# Patient Record
Sex: Female | Born: 1985 | Race: Asian | Hispanic: Yes | Marital: Married | Smoking: Never smoker
Health system: Southern US, Community
[De-identification: ages and names within clinical notes are randomized; demographics above are authoritative.]

## PROBLEM LIST (undated history)

## (undated) DIAGNOSIS — O09299 Supervision of pregnancy with other poor reproductive or obstetric history, unspecified trimester: Secondary | ICD-10-CM

## (undated) DIAGNOSIS — I8390 Asymptomatic varicose veins of unspecified lower extremity: Secondary | ICD-10-CM

## (undated) HISTORY — DX: Asymptomatic varicose veins of unspecified lower extremity: I83.90

## (undated) HISTORY — DX: Supervision of pregnancy with other poor reproductive or obstetric history, unspecified trimester: O09.299

---

## 2012-08-12 LAB — OB RESULTS CONSOLE GC/CHLAMYDIA
Chlamydia: NEGATIVE
Gonorrhea: NEGATIVE

## 2012-08-12 LAB — OB RESULTS CONSOLE HEPATITIS B SURFACE ANTIGEN: Hepatitis B Surface Ag: NEGATIVE

## 2012-08-13 ENCOUNTER — Other Ambulatory Visit (HOSPITAL_COMMUNITY): Payer: Self-pay | Admitting: Nurse Practitioner

## 2012-08-13 DIAGNOSIS — Z0489 Encounter for examination and observation for other specified reasons: Secondary | ICD-10-CM

## 2012-08-14 ENCOUNTER — Ambulatory Visit (HOSPITAL_COMMUNITY)
Admission: RE | Admit: 2012-08-14 | Discharge: 2012-08-14 | Disposition: A | Discharge status: Home / Self Care | Payer: Medicaid Other | Source: Ambulatory Visit | Attending: Nurse Practitioner | Admitting: Nurse Practitioner

## 2012-08-14 ENCOUNTER — Other Ambulatory Visit (HOSPITAL_COMMUNITY): Payer: Self-pay | Admitting: Nurse Practitioner

## 2012-08-14 DIAGNOSIS — Z1389 Encounter for screening for other disorder: Secondary | ICD-10-CM | POA: Insufficient documentation

## 2012-08-14 DIAGNOSIS — Z0489 Encounter for examination and observation for other specified reasons: Secondary | ICD-10-CM

## 2012-08-14 DIAGNOSIS — O358XX0 Maternal care for other (suspected) fetal abnormality and damage, not applicable or unspecified: Secondary | ICD-10-CM | POA: Insufficient documentation

## 2012-08-14 DIAGNOSIS — Z363 Encounter for antenatal screening for malformations: Secondary | ICD-10-CM | POA: Insufficient documentation

## 2012-09-09 ENCOUNTER — Other Ambulatory Visit (HOSPITAL_COMMUNITY): Payer: Self-pay | Admitting: Nurse Practitioner

## 2012-09-09 DIAGNOSIS — O365990 Maternal care for other known or suspected poor fetal growth, unspecified trimester, not applicable or unspecified: Secondary | ICD-10-CM

## 2012-09-14 ENCOUNTER — Ambulatory Visit (HOSPITAL_COMMUNITY)
Admission: RE | Admit: 2012-09-14 | Discharge: 2012-09-14 | Disposition: A | Discharge status: Home / Self Care | Payer: Medicaid Other | Source: Ambulatory Visit | Attending: Nurse Practitioner | Admitting: Nurse Practitioner

## 2012-09-14 DIAGNOSIS — O4400 Placenta previa specified as without hemorrhage, unspecified trimester: Secondary | ICD-10-CM | POA: Insufficient documentation

## 2012-09-14 DIAGNOSIS — O365990 Maternal care for other known or suspected poor fetal growth, unspecified trimester, not applicable or unspecified: Secondary | ICD-10-CM

## 2012-09-14 DIAGNOSIS — Z3689 Encounter for other specified antenatal screening: Secondary | ICD-10-CM | POA: Insufficient documentation

## 2012-10-05 ENCOUNTER — Other Ambulatory Visit (HOSPITAL_COMMUNITY): Payer: Self-pay | Admitting: Physician Assistant

## 2012-10-05 DIAGNOSIS — O4440 Low lying placenta NOS or without hemorrhage, unspecified trimester: Secondary | ICD-10-CM

## 2012-10-19 ENCOUNTER — Encounter (HOSPITAL_COMMUNITY): Payer: Self-pay

## 2012-10-19 ENCOUNTER — Ambulatory Visit (HOSPITAL_COMMUNITY)
Admission: RE | Admit: 2012-10-19 | Discharge: 2012-10-19 | Disposition: A | Discharge status: Home / Self Care | Payer: Medicaid Other | Source: Ambulatory Visit | Attending: Physician Assistant | Admitting: Physician Assistant

## 2012-10-19 DIAGNOSIS — Z3689 Encounter for other specified antenatal screening: Secondary | ICD-10-CM | POA: Insufficient documentation

## 2012-10-19 DIAGNOSIS — O4440 Low lying placenta NOS or without hemorrhage, unspecified trimester: Secondary | ICD-10-CM

## 2012-10-19 DIAGNOSIS — O4400 Placenta previa specified as without hemorrhage, unspecified trimester: Secondary | ICD-10-CM | POA: Insufficient documentation

## 2012-10-23 ENCOUNTER — Other Ambulatory Visit (HOSPITAL_COMMUNITY): Payer: Self-pay | Admitting: Nurse Practitioner

## 2012-10-23 ENCOUNTER — Other Ambulatory Visit: Payer: Self-pay | Admitting: Family Medicine

## 2012-10-23 DIAGNOSIS — O288 Other abnormal findings on antenatal screening of mother: Secondary | ICD-10-CM

## 2012-11-02 ENCOUNTER — Ambulatory Visit (HOSPITAL_COMMUNITY)
Admission: RE | Admit: 2012-11-02 | Discharge: 2012-11-02 | Disposition: A | Discharge status: Home / Self Care | Payer: Medicaid Other | Source: Ambulatory Visit | Attending: Nurse Practitioner | Admitting: Nurse Practitioner

## 2012-11-02 DIAGNOSIS — O288 Other abnormal findings on antenatal screening of mother: Secondary | ICD-10-CM

## 2012-11-02 DIAGNOSIS — O4100X0 Oligohydramnios, unspecified trimester, not applicable or unspecified: Secondary | ICD-10-CM | POA: Insufficient documentation

## 2012-11-02 DIAGNOSIS — Z3689 Encounter for other specified antenatal screening: Secondary | ICD-10-CM | POA: Insufficient documentation

## 2012-11-03 ENCOUNTER — Other Ambulatory Visit (HOSPITAL_COMMUNITY): Payer: Self-pay | Admitting: Family

## 2012-11-03 DIAGNOSIS — Z3689 Encounter for other specified antenatal screening: Secondary | ICD-10-CM

## 2012-11-30 ENCOUNTER — Ambulatory Visit (HOSPITAL_COMMUNITY)
Admission: RE | Admit: 2012-11-30 | Discharge: 2012-11-30 | Disposition: A | Discharge status: Home / Self Care | Payer: Medicaid Other | Source: Ambulatory Visit | Attending: Family | Admitting: Family

## 2012-11-30 DIAGNOSIS — Z3689 Encounter for other specified antenatal screening: Secondary | ICD-10-CM

## 2012-11-30 DIAGNOSIS — O4100X0 Oligohydramnios, unspecified trimester, not applicable or unspecified: Secondary | ICD-10-CM | POA: Insufficient documentation

## 2012-11-30 DIAGNOSIS — O4400 Placenta previa specified as without hemorrhage, unspecified trimester: Secondary | ICD-10-CM | POA: Insufficient documentation

## 2012-12-15 LAB — OB RESULTS CONSOLE GBS: GBS: NEGATIVE

## 2013-01-11 ENCOUNTER — Other Ambulatory Visit (HOSPITAL_COMMUNITY): Payer: Self-pay | Admitting: Nurse Practitioner

## 2013-01-11 DIAGNOSIS — O480 Post-term pregnancy: Secondary | ICD-10-CM

## 2013-01-12 ENCOUNTER — Encounter (HOSPITAL_COMMUNITY): Payer: Self-pay | Admitting: *Deleted

## 2013-01-12 ENCOUNTER — Telehealth (HOSPITAL_COMMUNITY): Payer: Self-pay | Admitting: *Deleted

## 2013-01-12 NOTE — Telephone Encounter (Signed)
Preadmission screen Interpreter number [phone]

## 2013-01-12 NOTE — Telephone Encounter (Signed)
[phone] interpreter number

## 2013-01-14 ENCOUNTER — Ambulatory Visit (HOSPITAL_COMMUNITY)
Admission: RE | Admit: 2013-01-14 | Discharge: 2013-01-14 | Disposition: A | Discharge status: Home / Self Care | Payer: Medicaid Other | Source: Ambulatory Visit | Attending: Nurse Practitioner | Admitting: Nurse Practitioner

## 2013-01-14 DIAGNOSIS — O480 Post-term pregnancy: Secondary | ICD-10-CM | POA: Insufficient documentation

## 2013-01-14 DIAGNOSIS — Z3689 Encounter for other specified antenatal screening: Secondary | ICD-10-CM | POA: Insufficient documentation

## 2013-01-21 ENCOUNTER — Inpatient Hospital Stay (HOSPITAL_COMMUNITY): Admission: RE | Admit: 2013-01-21 | Payer: Medicaid Other | Source: Ambulatory Visit

## 2013-01-21 ENCOUNTER — Inpatient Hospital Stay (HOSPITAL_COMMUNITY)
Admission: RE | Admit: 2013-01-21 | Discharge: 2013-02-01 | DRG: 765 | Disposition: A | Discharge status: Home / Self Care | Payer: Medicaid Other | Source: Ambulatory Visit | Attending: Surgery | Admitting: Surgery

## 2013-01-21 ENCOUNTER — Encounter (HOSPITAL_COMMUNITY): Payer: Self-pay

## 2013-01-21 ENCOUNTER — Ambulatory Visit (HOSPITAL_COMMUNITY): Payer: Medicaid Other

## 2013-01-21 ENCOUNTER — Inpatient Hospital Stay (HOSPITAL_COMMUNITY): Payer: Medicaid Other

## 2013-01-21 VITALS — BP 105/66 | HR 80 | Temp 98.4°F | Resp 18 | Ht 61.0 in | Wt 137.8 lb

## 2013-01-21 DIAGNOSIS — K661 Hemoperitoneum: Secondary | ICD-10-CM

## 2013-01-21 DIAGNOSIS — R58 Hemorrhage, not elsewhere classified: Secondary | ICD-10-CM

## 2013-01-21 DIAGNOSIS — D65 Disseminated intravascular coagulation [defibrination syndrome]: Secondary | ICD-10-CM

## 2013-01-21 DIAGNOSIS — Y838 Other surgical procedures as the cause of abnormal reaction of the patient, or of later complication, without mention of misadventure at the time of the procedure: Secondary | ICD-10-CM | POA: Diagnosis not present

## 2013-01-21 DIAGNOSIS — D689 Coagulation defect, unspecified: Secondary | ICD-10-CM | POA: Diagnosis not present

## 2013-01-21 DIAGNOSIS — R000 Tachycardia, unspecified: Secondary | ICD-10-CM | POA: Diagnosis present

## 2013-01-21 DIAGNOSIS — O34219 Maternal care for unspecified type scar from previous cesarean delivery: Secondary | ICD-10-CM | POA: Diagnosis present

## 2013-01-21 DIAGNOSIS — O411090 Infection of amniotic sac and membranes, unspecified, unspecified trimester, not applicable or unspecified: Secondary | ICD-10-CM

## 2013-01-21 DIAGNOSIS — Z90710 Acquired absence of both cervix and uterus: Secondary | ICD-10-CM | POA: Diagnosis not present

## 2013-01-21 DIAGNOSIS — O324XX0 Maternal care for high head at term, not applicable or unspecified: Secondary | ICD-10-CM | POA: Diagnosis present

## 2013-01-21 DIAGNOSIS — J95821 Acute postprocedural respiratory failure: Secondary | ICD-10-CM

## 2013-01-21 DIAGNOSIS — T8119XA Other postprocedural shock, initial encounter: Secondary | ICD-10-CM

## 2013-01-21 DIAGNOSIS — Z603 Acculturation difficulty: Secondary | ICD-10-CM

## 2013-01-21 DIAGNOSIS — IMO0002 Reserved for concepts with insufficient information to code with codable children: Secondary | ICD-10-CM

## 2013-01-21 DIAGNOSIS — R578 Other shock: Secondary | ICD-10-CM

## 2013-01-21 DIAGNOSIS — O480 Post-term pregnancy: Principal | ICD-10-CM | POA: Diagnosis present

## 2013-01-21 DIAGNOSIS — Y9210 Unspecified residential institution as the place of occurrence of the external cause: Secondary | ICD-10-CM | POA: Diagnosis not present

## 2013-01-21 DIAGNOSIS — K683 Retroperitoneal hematoma: Secondary | ICD-10-CM

## 2013-01-21 LAB — CBC
MCHC: 34.7 g/dL (ref 30.0–36.0)
RDW: 12.6 % (ref 11.5–15.5)

## 2013-01-21 LAB — ABO/RH: ABO/RH(D): A POS

## 2013-01-21 LAB — RPR: RPR Ser Ql: NONREACTIVE

## 2013-01-21 MED ORDER — OXYTOCIN 40 UNITS IN LACTATED RINGERS INFUSION - SIMPLE MED: 62.5000 mL/h | INTRAVENOUS | Status: DC

## 2013-01-21 MED ORDER — LACTATED RINGERS IV SOLN: 500.0000 mL | Freq: Once | INTRAVENOUS | Status: DC

## 2013-01-21 MED ORDER — FENTANYL 2.5 MCG/ML BUPIVACAINE 1/10 % EPIDURAL INFUSION (WH - ANES)
14.0000 mL/h | INTRAMUSCULAR | Status: DC | PRN
  Administered 2013-01-21 – 2013-01-22 (×2): 14 mL/h via EPIDURAL
  Filled 2013-01-21 (×2): qty 125

## 2013-01-21 MED ORDER — NALBUPHINE SYRINGE 5 MG/0.5 ML
10.0000 mg | INJECTION | INTRAMUSCULAR | Status: DC | PRN
Start: 2013-01-21 — End: 2013-01-22
  Administered 2013-01-21: 10 mg via INTRAVENOUS
  Filled 2013-01-21: qty 1

## 2013-01-21 MED ORDER — EPHEDRINE 5 MG/ML INJ
10.0000 mg | INTRAVENOUS | Status: DC | PRN
  Filled 2013-01-21: qty 4

## 2013-01-21 MED ORDER — CITRIC ACID-SODIUM CITRATE 334-500 MG/5ML PO SOLN
30.0000 mL | ORAL | Status: DC | PRN
  Administered 2013-01-22: 30 mL via ORAL
  Filled 2013-01-21: qty 15

## 2013-01-21 MED ORDER — IBUPROFEN 600 MG PO TABS: 600.0000 mg | ORAL_TABLET | Freq: Four times a day (QID) | ORAL | Status: DC | PRN

## 2013-01-21 MED ORDER — TERBUTALINE SULFATE 1 MG/ML IJ SOLN: 0.2500 mg | Freq: Once | INTRAMUSCULAR | Status: AC | PRN

## 2013-01-21 MED ORDER — OXYTOCIN BOLUS FROM INFUSION: 500.0000 mL | INTRAVENOUS | Status: DC

## 2013-01-21 MED ORDER — LACTATED RINGERS IV SOLN
INTRAVENOUS | Status: DC
  Administered 2013-01-21: 15:00:00 via INTRAVENOUS
  Administered 2013-01-21: 125 mL/h via INTRAVENOUS
  Administered 2013-01-21: 08:00:00 via INTRAVENOUS

## 2013-01-21 MED ORDER — LIDOCAINE HCL (PF) 1 % IJ SOLN
INTRAMUSCULAR | Status: DC | PRN
  Administered 2013-01-21 (×2): 5 mL

## 2013-01-21 MED ORDER — LIDOCAINE HCL (PF) 1 % IJ SOLN
30.0000 mL | INTRAMUSCULAR | Status: DC | PRN
  Filled 2013-01-21: qty 30

## 2013-01-21 MED ORDER — PHENYLEPHRINE 40 MCG/ML (10ML) SYRINGE FOR IV PUSH (FOR BLOOD PRESSURE SUPPORT)
80.0000 ug | PREFILLED_SYRINGE | INTRAVENOUS | Status: DC | PRN
  Administered 2013-01-22: 80 ug via INTRAVENOUS

## 2013-01-21 MED ORDER — OXYCODONE-ACETAMINOPHEN 5-325 MG PO TABS: 1.0000 | ORAL_TABLET | ORAL | Status: DC | PRN

## 2013-01-21 MED ORDER — OXYTOCIN 40 UNITS IN LACTATED RINGERS INFUSION - SIMPLE MED
1.0000 m[IU]/min | INTRAVENOUS | Status: DC
  Administered 2013-01-21: 2 m[IU]/min via INTRAVENOUS
  Filled 2013-01-21: qty 1000

## 2013-01-21 MED ORDER — LACTATED RINGERS IV SOLN
500.0000 mL | INTRAVENOUS | Status: DC | PRN
  Administered 2013-01-22: 500 mL via INTRAVENOUS

## 2013-01-21 MED ORDER — ONDANSETRON HCL 4 MG/2ML IJ SOLN: 4.0000 mg | Freq: Four times a day (QID) | INTRAMUSCULAR | Status: DC | PRN

## 2013-01-21 MED ORDER — EPHEDRINE 5 MG/ML INJ: 10.0000 mg | INTRAVENOUS | Status: DC | PRN

## 2013-01-21 MED ORDER — ACETAMINOPHEN 325 MG PO TABS
650.0000 mg | ORAL_TABLET | ORAL | Status: DC | PRN
  Administered 2013-01-22: 650 mg via ORAL
  Filled 2013-01-21 (×2): qty 1

## 2013-01-21 MED ORDER — DIPHENHYDRAMINE HCL 50 MG/ML IJ SOLN: 12.5000 mg | INTRAMUSCULAR | Status: DC | PRN

## 2013-01-21 MED ORDER — FLEET ENEMA 7-19 GM/118ML RE ENEM: 1.0000 | ENEMA | RECTAL | Status: DC | PRN

## 2013-01-21 MED ORDER — PHENYLEPHRINE 40 MCG/ML (10ML) SYRINGE FOR IV PUSH (FOR BLOOD PRESSURE SUPPORT)
80.0000 ug | PREFILLED_SYRINGE | INTRAVENOUS | Status: DC | PRN
  Filled 2013-01-21 (×2): qty 5

## 2013-01-21 NOTE — Progress Notes (Signed)
Late entry-  [employer] used for admission interview

## 2013-01-21 NOTE — Progress Notes (Signed)
Patient ID: [name], female   DOB: [birth year], 27 y.o.   MRN: [id number]  Doing well but now requests an epidural Husband here now.  AVSS Filed Vitals:   01/21/13 1720 01/21/13 1756 01/21/13 1857 01/21/13 1858  BP: 109/65 118/62  122/66  Pulse: 72 77  84  Temp:      TempSrc:      Resp:   18   Height:      Weight:      SpO2:       FHR reactive, no decels but continues to have episodes of arrhythmia  Dilation: 5 Effacement (%): 90 Cervical Position: Anterior Station: -1 Presentation: Vertex Exam by:: fran, cnm  Continue to observe

## 2013-01-21 NOTE — Anesthesia Preprocedure Evaluation (Signed)
Anesthesia Evaluation  Patient identified by MRN, date of birth, ID band Patient awake    Reviewed: Allergy & Precautions, H&P , Patient's Chart, lab work & pertinent test results  Airway Mallampati: II TM Distance: >3 FB Neck ROM: full    Dental no notable dental hx.    Pulmonary neg pulmonary ROS,  breath sounds clear to auscultation  Pulmonary exam normal       Cardiovascular negative cardio ROS  Rhythm:regular Rate:Normal     Neuro/Psych negative neurological ROS  negative psych ROS   GI/Hepatic negative GI ROS, Neg liver ROS,   Endo/Other  negative endocrine ROS  Renal/GU negative Renal ROS     Musculoskeletal   Abdominal   Peds  Hematology negative hematology ROS (+)   Anesthesia Other Findings Varicose veins     Hx of macrosomia in infant in prior pregnancy, currently pregnant   Abnormal fetal heart rhythm  Reproductive/Obstetrics (+) Pregnancy                           Anesthesia Physical Anesthesia Plan  ASA: II  Anesthesia Plan: Epidural   Post-op Pain Management:    Induction:   Airway Management Planned:   Additional Equipment:   Intra-op Plan:   Post-operative Plan:   Informed Consent: I have reviewed the patients History and Physical, chart, labs and discussed the procedure including the risks, benefits and alternatives for the proposed anesthesia with the patient or authorized representative who has indicated his/her understanding and acceptance.     Plan Discussed with:   Anesthesia Plan Comments:         Anesthesia Quick Evaluation

## 2013-01-21 NOTE — H&P (Signed)

## 2013-01-21 NOTE — Anesthesia Procedure Notes (Signed)
Epidural Patient location during procedure: OB Start time: 01/21/2013 8:02 PM  Staffing Anesthesiologist: Angus Seller., Harrell Gave. Performed by: anesthesiologist   Preanesthetic Checklist Completed: patient identified, site marked, surgical consent, pre-op evaluation, timeout performed, IV checked, risks and benefits discussed and monitors and equipment checked  Epidural Patient position: sitting Prep: site prepped and draped and DuraPrep Patient monitoring: continuous pulse ox and blood pressure Approach: midline Injection technique: LOR air and LOR saline  Needle:  Needle type: Tuohy  Needle gauge: 17 G Needle length: 9 cm and 9 Needle insertion depth: 5 cm cm Catheter type: closed end flexible Catheter size: 19 Gauge Catheter at skin depth: 10 cm Test dose: negative  Assessment Events: blood not aspirated, injection not painful, no injection resistance, negative IV test and no paresthesia  Additional Notes Patient identified.  Risk benefits discussed including failed block, incomplete pain control, headache, nerve damage, paralysis, blood pressure changes, nausea, vomiting, reactions to medication both toxic or allergic, and postpartum back pain.  Patient expressed understanding and wished to proceed.  All questions were answered.  Sterile technique used throughout procedure and epidural site dressed with sterile barrier dressing. No paresthesia or other complications noted.The patient did not experience any signs of intravascular injection such as tinnitus or metallic taste in mouth nor signs of intrathecal spread such as rapid motor block. Please see nursing notes for vital signs.

## 2013-01-21 NOTE — Progress Notes (Signed)
Mackenzie [name] is a 27 y.o. G3P1011 at [redacted]w[redacted]d by admitted for induction of labor due to Post dates.  Subjective: Comfortable with epidural  Objective: BP 115/59  Pulse 79  Temp(Src) 99.5 F (37.5 C) (Oral)  Resp 18  Ht 5\' 1"  (1.549 m)  Wt 69.4 kg (153 lb)  BMI 28.92 kg/m2  SpO2 99%  LMP 03/22/2012   Total I/O In: -  Out: 200 [Urine:200]  FHT:  FHR: 140 bpm, variability: moderate,  accelerations:  Present,  decelerations:  Present Periods where fetal HR drops to the 50's secondary to arrythmia UC:   regular, every 1-3 minutes  SVE:  Dilation 9.5  Dilation: Lip/rim Effacement (%): 100 Station: 0;+1 Exam by:: T. Sprague RN  Labs: Lab Results  Component Value Date   WBC 11.5* 01/21/2013   HGB 13.5 01/21/2013   HCT 38.9 01/21/2013   MCV 90.5 01/21/2013   PLT 126* 01/21/2013    Assessment / Plan: Induction of labor due to post dates, progressing well on pitocin  Labor: Progressing normally Fetal Wellbeing:  Category II Pain Control:  Epidural Anticipated MOD:  NSVD  Mackenzie [name] Other 01/21/2013, 10:02 PM  I have seen and examined this patient and agree the above assessment. Mackenzie [name] 01/22/2013 2:06 AM

## 2013-01-21 NOTE — Progress Notes (Signed)
   Mackenzie [name] is a 27 y.o. G3P1011 at [redacted]w[redacted]d  admitted for induction of labor due to Post dates.. TOLAC Subjective: Desires IV pain meds  Objective: BP 109/65  Pulse 72  Temp(Src) 97.4 F (36.3 C) (Oral)  Resp 18  Ht 5\' 1"  (1.549 m)  Wt 69.4 kg (153 lb)  BMI 28.92 kg/m2  SpO2 99%  LMP 03/22/2012    FHT:  FHR: 140 bpm, variability: moderate,  accelerations:  Present,  decelerations:  Present irregular arrythmia where FHR is in the 50's, usually no more than 1-2 minutes UC:   regular, every 2-3 minutes SVE:   Dilation: 5 Effacement (%): 90 Station: -1 Exam by:: fran, cnm Pitocin @ 10 mu/min SROM with light mec  Labs: Lab Results  Component Value Date   WBC 11.5* 01/21/2013   HGB 13.5 01/21/2013   HCT 38.9 01/21/2013   MCV 90.5 01/21/2013   PLT 126* 01/21/2013    Assessment / Plan: Induction of labor due to postterm,  progressing well on pitocin  Labor: Progressing normally Fetal Wellbeing:  Category II Pain Control:  nubain Anticipated MOD:  NSVD  CRESENZO-DISHMAN,Meribeth Vitug 01/21/2013, 5:34 PM

## 2013-01-21 NOTE — H&P (Signed)
Mackenzie [name] is a 27 y.o. female presenting for IOL for postdates . Maternal Medical History:  Reason for admission: Nausea.  Contractions: Frequency: rare.   Perceived severity is mild.    Fetal activity: Perceived fetal activity is normal.   Last perceived fetal movement was within the past hour.    Prenatal complications: No bleeding.   Prenatal Complications - Diabetes: none.    OB History   Grav Para Term Preterm Abortions TAB SAB Ect Mult Living   3 1 1  1  1   1      Past Medical History  Diagnosis Date  . Varicose veins   . Hx of macrosomia in infant in prior pregnancy, currently pregnant    Past Surgical History  Procedure Laterality Date  . Cesarean section     Family History: family history is negative for Alcohol abuse, and Arthritis, and Asthma, and Birth defects, and Cancer, and COPD, and Depression, and Diabetes, and Drug abuse, and Early death, and Hearing loss, and Heart disease, and Kidney disease, and Hypertension, and Hyperlipidemia, and Learning disabilities, and Mental illness, and Mental retardation, and Miscarriages / Stillbirths, and Stroke, and Vision loss, . Social History:  reports that she has never smoked. She has never used smokeless tobacco. She reports that she does not drink alcohol or use illicit drugs.  Review of Systems  Constitutional: Negative for fever, chills and malaise/fatigue.  Gastrointestinal: Negative for nausea, vomiting, abdominal pain, diarrhea and constipation.  Genitourinary: Negative for dysuria.  Neurological: Negative for dizziness and headaches.    Dilation: 2.5 Effacement (%): 90 Station:  (BBOW) Exam by:: k. shaw, cnm Blood pressure 113/64, pulse 84, temperature 97.2 F (36.2 C), temperature source Oral, resp. rate 18, height 5\' 1"  (1.549 m), weight 69.4 kg (153 lb), last menstrual period 03/22/2012, SpO2 98.00%. Maternal Exam:  Uterine Assessment: Contraction strength is mild.  Contraction frequency is rare.    Abdomen: Fundal height is 37.   Estimated fetal weight is 8.   Fetal presentation: vertex  Introitus: Normal vulva. Normal vagina.  Ferning test: not done.  Nitrazine test: not done. Amniotic fluid character: not assessed.  Pelvis: adequate for delivery.   Cervix: Cervix evaluated by digital exam.     Fetal Exam Fetal Monitor Review: Mode: ultrasound.   Baseline rate: 145.  Variability: moderate (6-25 bpm).   Pattern: accelerations present.    Fetal State Assessment: Category II - tracings are indeterminate.    Fetal heart rate Arrhythmia noted with intervals between contractions Heart rate is counted as 50s when the arrhythmias occur  Physical Exam  Constitutional: She is oriented to person, place, and time. She appears well-developed and well-nourished. No distress.  HENT:  Head: Normocephalic.  Cardiovascular: Normal rate and regular rhythm.   Respiratory: Effort normal.  GI: Soft. She exhibits no distension. There is no tenderness. There is no rebound and no guarding.  Musculoskeletal: Normal range of motion.  Neurological: She is alert and oriented to person, place, and time.  Skin: Skin is warm and dry.  Psychiatric: She has a normal mood and affect.    Prenatal labs: ABO, Rh: A/Positive/-- (12/11 0000) Antibody: Negative (12/11 0000) Rubella: Immune (12/11 0000) RPR: Nonreactive (12/11 0000)  HBsAg: Negative (12/11 0000)  HIV: Non-reactive (12/11 0000)  GBS: Negative (04/15 0000)   Assessment/Plan: A:  SIUP at [redacted]w[redacted]d       Post Dates      Language Barrier      Fetal Arrhythmia  P:  Admit to [employer]      Routine orders      Pitocin      Would like Epidural, but Anesthesia is hesitant to do an elective procedure without an interpretor   D      Dr Leroy Libman and Sherrie George consulted.  Will watch FHR pattern. If worsens may expedite delivery.   [facility] 01/21/2013, 10:03 AM

## 2013-01-21 NOTE — Progress Notes (Signed)
Patient ID: Mackenzie [name], female   DOB: [birth year], 27 y.o.   MRN: [id number]  Notified by RN that presenting part could not be felt in pelvis.  FHR stable but continues to have arrhythmia episodes UCs irregular  Dilation: 2.5 Effacement (%): 90 Cervical Position: Anterior Station:  (BBOW) Presentation: Vertex (by WH u/s) Exam by:: hk  [place] ordered >> confirmed vertex

## 2013-01-21 NOTE — Progress Notes (Signed)
   Mackenzie [name] is a 27 y.o. G3P1011 at [redacted]w[redacted]d  admitted for induction of labor due to Post dates.. TOLAC Subjective: Comfortable with epidural  Objective: BP 101/57  Pulse 74  Temp(Src) 99.5 F (37.5 C) (Oral)  Resp 18  Ht 5\' 1"  (1.549 m)  Wt 69.4 kg (153 lb)  BMI 28.92 kg/m2  SpO2 99%  LMP 03/22/2012 Total I/O In: -  Out: 200 [Urine:200]  FHT:  FHR: 140 bpm, variability: moderate,  accelerations:  Present,  decelerations:  Present irregular arrythmia where FHR is in the 50's, usually no more than 1-2 minutes UC:   regular, every 2-3 minutes SVE:   Dilation: 8 Effacement (%): 90 Station: 0 Exam by:: T. Sprague Pitocin @ 10 mu/min  Labs: Lab Results  Component Value Date   WBC 11.5* 01/21/2013   HGB 13.5 01/21/2013   HCT 38.9 01/21/2013   MCV 90.5 01/21/2013   PLT 126* 01/21/2013    Assessment / Plan: Induction of labor due to postterm,  progressing well on pitocin  Labor: Progressing normally Fetal Wellbeing:  Category II Pain Control:  epidural Anticipated MOD:  NSVD  CRESENZO-DISHMAN,Ranell Skibinski 01/21/2013, 9:31 PM

## 2013-01-22 ENCOUNTER — Encounter (HOSPITAL_COMMUNITY): Payer: Self-pay | Admitting: Anesthesiology

## 2013-01-22 ENCOUNTER — Inpatient Hospital Stay (HOSPITAL_COMMUNITY): Payer: Medicaid Other | Admitting: Anesthesiology

## 2013-01-22 ENCOUNTER — Encounter (HOSPITAL_COMMUNITY)
Admission: RE | Disposition: A | Discharge status: Home / Self Care | Payer: Self-pay | Source: Ambulatory Visit | Attending: Critical Care Medicine

## 2013-01-22 ENCOUNTER — Inpatient Hospital Stay (HOSPITAL_COMMUNITY): Payer: Medicaid Other

## 2013-01-22 ENCOUNTER — Encounter (HOSPITAL_COMMUNITY): Payer: Self-pay

## 2013-01-22 DIAGNOSIS — T8119XA Other postprocedural shock, initial encounter: Secondary | ICD-10-CM

## 2013-01-22 DIAGNOSIS — R578 Other shock: Secondary | ICD-10-CM | POA: Diagnosis not present

## 2013-01-22 DIAGNOSIS — D689 Coagulation defect, unspecified: Secondary | ICD-10-CM

## 2013-01-22 DIAGNOSIS — O34219 Maternal care for unspecified type scar from previous cesarean delivery: Secondary | ICD-10-CM

## 2013-01-22 DIAGNOSIS — Z90710 Acquired absence of both cervix and uterus: Secondary | ICD-10-CM | POA: Diagnosis not present

## 2013-01-22 DIAGNOSIS — IMO0002 Reserved for concepts with insufficient information to code with codable children: Secondary | ICD-10-CM

## 2013-01-22 DIAGNOSIS — O9912 Other diseases of the blood and blood-forming organs and certain disorders involving the immune mechanism complicating childbirth: Secondary | ICD-10-CM

## 2013-01-22 DIAGNOSIS — O324XX0 Maternal care for high head at term, not applicable or unspecified: Secondary | ICD-10-CM

## 2013-01-22 DIAGNOSIS — O480 Post-term pregnancy: Secondary | ICD-10-CM

## 2013-01-22 HISTORY — PX: CYSTOSCOPY: SHX5120

## 2013-01-22 HISTORY — PX: SALPINGOOPHORECTOMY: SHX82

## 2013-01-22 HISTORY — PX: ABDOMINAL HYSTERECTOMY: SHX81

## 2013-01-22 LAB — CBC
HCT: 26.4 % — ABNORMAL LOW (ref 36.0–46.0)
HCT: 20.8 % — ABNORMAL LOW (ref 36.0–46.0)
Hemoglobin: 9.1 g/dL — ABNORMAL LOW (ref 12.0–15.0)
Hemoglobin: 8 g/dL — ABNORMAL LOW (ref 12.0–15.0)
Hemoglobin: 7.4 g/dL — ABNORMAL LOW (ref 12.0–15.0)
Hemoglobin: 6.7 g/dL — CL (ref 12.0–15.0)
MCH: 27.4 pg (ref 26.0–34.0)
MCH: 28.2 pg (ref 26.0–34.0)
MCHC: 34.5 g/dL (ref 30.0–36.0)
MCHC: 35.6 g/dL (ref 30.0–36.0)
MCHC: 35.4 g/dL (ref 30.0–36.0)
MCV: 81.2 fL (ref 78.0–100.0)
MCV: 79.4 fL (ref 78.0–100.0)
Platelets: 67 10*3/uL — ABNORMAL LOW (ref 150–400)
Platelets: 124 10*3/uL — ABNORMAL LOW (ref 150–400)
RBC: 2.92 MIL/uL — ABNORMAL LOW (ref 3.87–5.11)
RBC: 2.62 MIL/uL — ABNORMAL LOW (ref 3.87–5.11)
RBC: 2.4 MIL/uL — ABNORMAL LOW (ref 3.87–5.11)
RDW: 16 % — ABNORMAL HIGH (ref 11.5–15.5)
WBC: 19.6 10*3/uL — ABNORMAL HIGH (ref 4.0–10.5)
WBC: 14.4 10*3/uL — ABNORMAL HIGH (ref 4.0–10.5)
WBC: 17.7 10*3/uL — ABNORMAL HIGH (ref 4.0–10.5)
WBC: 18.4 10*3/uL — ABNORMAL HIGH (ref 4.0–10.5)

## 2013-01-22 LAB — BASIC METABOLIC PANEL
BUN: 8 mg/dL (ref 6–23)
Calcium: 7.8 mg/dL — ABNORMAL LOW (ref 8.4–10.5)
Creatinine, Ser: 0.84 mg/dL (ref 0.50–1.10)
GFR calc Af Amer: >90 mL/min (ref >90)
GFR calc Af Amer: >90 mL/min (ref >90)
GFR calc non Af Amer: >90 mL/min (ref >90)
GFR calc non Af Amer: >90 mL/min (ref >90)
Glucose, Bld: 104 mg/dL — ABNORMAL HIGH (ref 70–99)
Potassium: 3.9 mEq/L (ref 3.5–5.1)
Sodium: 131 mEq/L — ABNORMAL LOW (ref 135–145)

## 2013-01-22 LAB — COMPREHENSIVE METABOLIC PANEL
ALT: 9 U/L (ref 0–35)
AST: 18 U/L (ref 0–37)
Albumin: 1.6 g/dL — ABNORMAL LOW (ref 3.5–5.2)
Alkaline Phosphatase: 43 U/L (ref 39–117)
BUN: 12 mg/dL (ref 6–23)
CO2: 23 mEq/L (ref 19–32)
Calcium: 7.4 mg/dL — ABNORMAL LOW (ref 8.4–10.5)
Chloride: 102 mEq/L (ref 96–112)
Creatinine, Ser: 0.77 mg/dL (ref 0.50–1.10)
GFR calc Af Amer: >90 mL/min (ref >90)
GFR calc non Af Amer: >90 mL/min (ref >90)
Glucose, Bld: 148 mg/dL — ABNORMAL HIGH (ref 70–99)
Potassium: 3.9 mEq/L (ref 3.5–5.1)
Sodium: 133 mEq/L — ABNORMAL LOW (ref 135–145)
Total Bilirubin: 3.1 mg/dL — ABNORMAL HIGH (ref 0.3–1.2)
Total Protein: 3.4 g/dL — ABNORMAL LOW (ref 6.0–8.3)

## 2013-01-22 LAB — FIBRINOGEN: Fibrinogen: 280 mg/dL (ref 204–475)

## 2013-01-22 LAB — MASSIVE TRANSFUSION PROTOCOL ORDER (BLOOD BANK NOTIFICATION)

## 2013-01-22 LAB — DIC (DISSEMINATED INTRAVASCULAR COAGULATION)PANEL
D-Dimer, Quant: 0.89 ug/mL-FEU — ABNORMAL HIGH (ref 0.00–0.48)
Fibrinogen: 241 mg/dL (ref 204–475)
INR: 1.48 (ref 0.00–1.49)
Platelets: 114 10*3/uL — ABNORMAL LOW (ref 150–400)
Platelets: 124 10*3/uL — ABNORMAL LOW (ref 150–400)
Prothrombin Time: 17.5 seconds — ABNORMAL HIGH (ref 11.6–15.2)
Smear Review: NONE SEEN
Smear Review: NONE SEEN
aPTT: 92 seconds — ABNORMAL HIGH (ref 24–37)

## 2013-01-22 LAB — BLOOD GAS, ARTERIAL
FIO2: 0.21 %
pCO2 arterial: 30.7 mmHg — ABNORMAL LOW (ref 35.0–45.0)
pO2, Arterial: 112 mmHg — ABNORMAL HIGH (ref 80.0–100.0)

## 2013-01-22 LAB — PREPARE RBC (CROSSMATCH)

## 2013-01-22 LAB — PHOSPHORUS: Phosphorus: 5 mg/dL — ABNORMAL HIGH (ref 2.3–4.6)

## 2013-01-22 LAB — MAGNESIUM: Magnesium: 1.1 mg/dL — ABNORMAL LOW (ref 1.5–2.5)

## 2013-01-22 SURGERY — Surgical Case
Anesthesia: Epidural | Site: Urethra | Laterality: Right | Wound class: Clean Contaminated

## 2013-01-22 MED ORDER — MAGNESIUM HYDROXIDE 400 MG/5ML PO SUSP
30.0000 mL | ORAL | Status: DC | PRN
  Filled 2013-01-22: qty 30

## 2013-01-22 MED ORDER — FENTANYL CITRATE 0.05 MG/ML IJ SOLN
INTRAMUSCULAR | Status: AC
  Filled 2013-01-22: qty 2

## 2013-01-22 MED ORDER — MENTHOL 3 MG MT LOZG: 1.0000 | LOZENGE | OROMUCOSAL | Status: DC | PRN

## 2013-01-22 MED ORDER — LANOLIN HYDROUS EX OINT: 1.0000 "application " | TOPICAL_OINTMENT | CUTANEOUS | Status: DC | PRN

## 2013-01-22 MED ORDER — PHENYLEPHRINE 40 MCG/ML (10ML) SYRINGE FOR IV PUSH (FOR BLOOD PRESSURE SUPPORT)
PREFILLED_SYRINGE | INTRAVENOUS | Status: AC
  Filled 2013-01-22: qty 5

## 2013-01-22 MED ORDER — SIMETHICONE 80 MG PO CHEW
80.0000 mg | CHEWABLE_TABLET | ORAL | Status: DC | PRN
  Filled 2013-01-22: qty 1

## 2013-01-22 MED ORDER — LACTATED RINGERS IV SOLN
INTRAVENOUS | Status: DC | PRN
  Administered 2013-01-22: 09:00:00 via INTRAVENOUS

## 2013-01-22 MED ORDER — OXYTOCIN 10 UNIT/ML IJ SOLN
INTRAMUSCULAR | Status: AC
  Filled 2013-01-22: qty 4

## 2013-01-22 MED ORDER — MORPHINE SULFATE 0.5 MG/ML IJ SOLN
INTRAMUSCULAR | Status: AC
  Filled 2013-01-22: qty 10

## 2013-01-22 MED ORDER — CHLOROPROCAINE HCL 3 % IJ SOLN
INTRAMUSCULAR | Status: DC | PRN
  Administered 2013-01-22: 40 mL

## 2013-01-22 MED ORDER — FENTANYL CITRATE 0.05 MG/ML IJ SOLN: 25.0000 ug | INTRAMUSCULAR | Status: DC | PRN

## 2013-01-22 MED ORDER — PRENATAL MULTIVITAMIN CH
1.0000 | ORAL_TABLET | Freq: Every day | ORAL | Status: DC
  Filled 2013-01-22: qty 1

## 2013-01-22 MED ORDER — LIDOCAINE-EPINEPHRINE (PF) 2 %-1:200000 IJ SOLN
INTRAMUSCULAR | Status: AC
  Filled 2013-01-22: qty 20

## 2013-01-22 MED ORDER — INDIGOTINDISULFONATE SODIUM 8 MG/ML IJ SOLN
INTRAMUSCULAR | Status: DC | PRN
  Administered 2013-01-22 (×2): 5 mL via INTRAVENOUS

## 2013-01-22 MED ORDER — DIPHENHYDRAMINE HCL 25 MG PO CAPS: 25.0000 mg | ORAL_CAPSULE | Freq: Four times a day (QID) | ORAL | Status: DC | PRN

## 2013-01-22 MED ORDER — NALBUPHINE HCL 10 MG/ML IJ SOLN: 5.0000 mg | INTRAMUSCULAR | Status: DC | PRN

## 2013-01-22 MED ORDER — LACTATED RINGERS IV SOLN
INTRAVENOUS | Status: DC | PRN
  Administered 2013-01-22 (×2): via INTRAVENOUS

## 2013-01-22 MED ORDER — ZOLPIDEM TARTRATE 5 MG PO TABS: 5.0000 mg | ORAL_TABLET | Freq: Every evening | ORAL | Status: DC | PRN

## 2013-01-22 MED ORDER — METHYLERGONOVINE MALEATE 0.2 MG/ML IJ SOLN
INTRAMUSCULAR | Status: AC
  Filled 2013-01-22: qty 1

## 2013-01-22 MED ORDER — NALOXONE HCL 1 MG/ML IJ SOLN: 1.0000 ug/kg/h | INTRAVENOUS | Status: DC | PRN

## 2013-01-22 MED ORDER — BUPIVACAINE HCL (PF) 0.5 % IJ SOLN
INTRAMUSCULAR | Status: AC
  Filled 2013-01-22: qty 30

## 2013-01-22 MED ORDER — GENTAMICIN SULFATE 40 MG/ML IJ SOLN
140.0000 mg | Freq: Three times a day (TID) | INTRAVENOUS | Status: DC
  Filled 2013-01-22 (×3): qty 3.5

## 2013-01-22 MED ORDER — MAGNESIUM SULFATE 50 % IJ SOLN
6.0000 g | Freq: Once | INTRAVENOUS | Status: AC
  Administered 2013-01-22: 6 g via INTRAVENOUS
  Filled 2013-01-22: qty 12

## 2013-01-22 MED ORDER — METOCLOPRAMIDE HCL 5 MG/ML IJ SOLN
10.0000 mg | Freq: Three times a day (TID) | INTRAMUSCULAR | Status: DC | PRN
  Filled 2013-01-22: qty 2

## 2013-01-22 MED ORDER — KETOROLAC TROMETHAMINE 30 MG/ML IJ SOLN: 30.0000 mg | Freq: Four times a day (QID) | INTRAMUSCULAR | Status: DC | PRN

## 2013-01-22 MED ORDER — CEFAZOLIN SODIUM-DEXTROSE 2-3 GM-% IV SOLR
INTRAVENOUS | Status: AC
  Filled 2013-01-22: qty 50

## 2013-01-22 MED ORDER — ONDANSETRON HCL 4 MG PO TABS: 4.0000 mg | ORAL_TABLET | ORAL | Status: DC | PRN

## 2013-01-22 MED ORDER — SODIUM BICARBONATE 8.4 % IV SOLN
INTRAVENOUS | Status: AC
  Filled 2013-01-22: qty 50

## 2013-01-22 MED ORDER — MEPERIDINE HCL 25 MG/ML IJ SOLN: 6.2500 mg | INTRAMUSCULAR | Status: DC | PRN

## 2013-01-22 MED ORDER — SCOPOLAMINE 1 MG/3DAYS TD PT72: 1.0000 | MEDICATED_PATCH | Freq: Once | TRANSDERMAL | Status: DC

## 2013-01-22 MED ORDER — DIBUCAINE 1 % RE OINT: 1.0000 "application " | TOPICAL_OINTMENT | RECTAL | Status: DC | PRN

## 2013-01-22 MED ORDER — ONDANSETRON HCL 4 MG/2ML IJ SOLN: 4.0000 mg | Freq: Three times a day (TID) | INTRAMUSCULAR | Status: DC | PRN

## 2013-01-22 MED ORDER — INDIGOTINDISULFONATE SODIUM 8 MG/ML IJ SOLN
INTRAMUSCULAR | Status: AC
  Filled 2013-01-22: qty 5

## 2013-01-22 MED ORDER — LACTATED RINGERS IV SOLN
INTRAVENOUS | Status: DC | PRN
  Administered 2013-01-22 (×3): via INTRAVENOUS

## 2013-01-22 MED ORDER — GENTAMICIN SULFATE 40 MG/ML IJ SOLN
150.0000 mg | Freq: Once | INTRAVENOUS | Status: AC
  Administered 2013-01-22: 150 mg via INTRAVENOUS
  Filled 2013-01-22: qty 3.75

## 2013-01-22 MED ORDER — PIPERACILLIN-TAZOBACTAM 3.375 G IVPB
3.3750 g | Freq: Three times a day (TID) | INTRAVENOUS | Status: DC
  Administered 2013-01-22 – 2013-01-23 (×2): 3.375 g via INTRAVENOUS
  Filled 2013-01-22 (×3): qty 50

## 2013-01-22 MED ORDER — CALCIUM GLUCONATE 10 % IV SOLN
INTRAVENOUS | Status: AC
  Filled 2013-01-22: qty 10

## 2013-01-22 MED ORDER — PANTOPRAZOLE SODIUM 40 MG IV SOLR
40.0000 mg | Freq: Every day | INTRAVENOUS | Status: DC
  Administered 2013-01-22 – 2013-01-30 (×9): 40 mg via INTRAVENOUS
  Filled 2013-01-22 (×13): qty 40

## 2013-01-22 MED ORDER — WITCH HAZEL-GLYCERIN EX PADS: 1.0000 "application " | MEDICATED_PAD | CUTANEOUS | Status: DC | PRN

## 2013-01-22 MED ORDER — MORPHINE SULFATE (PF) 0.5 MG/ML IJ SOLN
INTRAMUSCULAR | Status: DC | PRN
  Administered 2013-01-22: 3 ug via EPIDURAL

## 2013-01-22 MED ORDER — HYDROMORPHONE HCL PF 1 MG/ML IJ SOLN: 1.0000 mg | INTRAMUSCULAR | Status: DC | PRN

## 2013-01-22 MED ORDER — LACTATED RINGERS IV SOLN: INTRAVENOUS | Status: DC

## 2013-01-22 MED ORDER — SODIUM CHLORIDE 0.9 % IV SOLN
INTRAVENOUS | Status: DC | PRN
  Administered 2013-01-22: 09:00:00 via INTRAVENOUS

## 2013-01-22 MED ORDER — NOREPINEPHRINE BITARTRATE 1 MG/ML IJ SOLN
2.0000 ug/min | INTRAMUSCULAR | Status: DC
  Filled 2013-01-22 (×2): qty 8

## 2013-01-22 MED ORDER — SODIUM CHLORIDE 0.9 % IV SOLN
INTRAVENOUS | Status: DC
  Administered 2013-01-22 – 2013-01-25 (×3): via INTRAVENOUS

## 2013-01-22 MED ORDER — SENNOSIDES-DOCUSATE SODIUM 8.6-50 MG PO TABS
2.0000 | ORAL_TABLET | Freq: Every day | ORAL | Status: DC
  Filled 2013-01-22: qty 2

## 2013-01-22 MED ORDER — STERILE WATER FOR IRRIGATION IR SOLN
Status: DC | PRN
  Administered 2013-01-22: 3000 mL via INTRAVESICAL

## 2013-01-22 MED ORDER — CEFAZOLIN SODIUM-DEXTROSE 2-3 GM-% IV SOLR
INTRAVENOUS | Status: DC | PRN
  Administered 2013-01-22 (×2): 2 g via INTRAVENOUS

## 2013-01-22 MED ORDER — PHENYLEPHRINE HCL 10 MG/ML IJ SOLN
INTRAMUSCULAR | Status: DC | PRN
  Administered 2013-01-22 (×2): 40 ug via INTRAVENOUS
  Administered 2013-01-22 (×2): 80 ug via INTRAVENOUS
  Administered 2013-01-22: 40 ug via INTRAVENOUS
  Administered 2013-01-22 (×4): 80 ug via INTRAVENOUS
  Administered 2013-01-22: 40 ug via INTRAVENOUS

## 2013-01-22 MED ORDER — OXYTOCIN 10 UNIT/ML IJ SOLN
40.0000 [IU] | INTRAVENOUS | Status: DC | PRN
  Administered 2013-01-22: 40 [IU] via INTRAVENOUS

## 2013-01-22 MED ORDER — 0.9 % SODIUM CHLORIDE (POUR BTL) OPTIME
TOPICAL | Status: DC | PRN
  Administered 2013-01-22: 1000 mL

## 2013-01-22 MED ORDER — NOREPINEPHRINE BITARTRATE 1 MG/ML IJ SOLN
2.0000 ug/min | INTRAVENOUS | Status: DC
  Filled 2013-01-22 (×2): qty 4

## 2013-01-22 MED ORDER — DIPHENHYDRAMINE HCL 50 MG/ML IJ SOLN: 25.0000 mg | INTRAMUSCULAR | Status: DC | PRN

## 2013-01-22 MED ORDER — NOREPINEPHRINE BITARTRATE 1 MG/ML IJ SOLN
2.0000 ug/min | INTRAVENOUS | Status: DC
  Administered 2013-01-22: 20 ug/min via INTRAVENOUS
  Filled 2013-01-22: qty 4

## 2013-01-22 MED ORDER — MORPHINE SULFATE (PF) 0.5 MG/ML IJ SOLN
INTRAMUSCULAR | Status: DC | PRN
  Administered 2013-01-22: 2 ug via INTRAVENOUS

## 2013-01-22 MED ORDER — FENTANYL CITRATE 0.05 MG/ML IJ SOLN
INTRAMUSCULAR | Status: DC | PRN
  Administered 2013-01-22 (×2): 50 ug via INTRAVENOUS

## 2013-01-22 MED ORDER — DEXTROSE 5 % IV SOLN
30.0000 ug/min | INTRAVENOUS | Status: DC
  Administered 2013-01-22: 50 ug/min via INTRAVENOUS
  Administered 2013-01-22: 170 ug/min via INTRAVENOUS
  Filled 2013-01-22 (×2): qty 1

## 2013-01-22 MED ORDER — SODIUM CHLORIDE 0.9 % IJ SOLN: 3.0000 mL | INTRAMUSCULAR | Status: DC | PRN

## 2013-01-22 MED ORDER — SODIUM CHLORIDE 0.9 % IV SOLN
1.0000 g | Freq: Once | INTRAVENOUS | Status: AC
  Administered 2013-01-22: 1 g via INTRAVENOUS
  Filled 2013-01-22: qty 10

## 2013-01-22 MED ORDER — TETANUS-DIPHTH-ACELL PERTUSSIS 5-2.5-18.5 LF-MCG/0.5 IM SUSP
0.5000 mL | Freq: Once | INTRAMUSCULAR | Status: DC
  Filled 2013-01-22: qty 0.5

## 2013-01-22 MED ORDER — METHYLERGONOVINE MALEATE 0.2 MG/ML IJ SOLN
INTRAMUSCULAR | Status: DC | PRN
  Administered 2013-01-22: 0.2 mg via INTRAMUSCULAR
  Administered 2013-01-22: .2 mg via INTRAMUSCULAR

## 2013-01-22 MED ORDER — KETOROLAC TROMETHAMINE 60 MG/2ML IM SOLN: 60.0000 mg | Freq: Once | INTRAMUSCULAR | Status: DC | PRN

## 2013-01-22 MED ORDER — PHENYLEPHRINE HCL 10 MG/ML IJ SOLN
INTRAMUSCULAR | Status: AC
  Filled 2013-01-22: qty 1

## 2013-01-22 MED ORDER — PHENYLEPHRINE HCL 10 MG/ML IJ SOLN
30.0000 ug/min | INTRAVENOUS | Status: DC
  Filled 2013-01-22 (×2): qty 4

## 2013-01-22 MED ORDER — DIPHENHYDRAMINE HCL 50 MG/ML IJ SOLN: 12.5000 mg | INTRAMUSCULAR | Status: DC | PRN

## 2013-01-22 MED ORDER — ONDANSETRON HCL 4 MG/2ML IJ SOLN: 4.0000 mg | INTRAMUSCULAR | Status: DC | PRN

## 2013-01-22 MED ORDER — SODIUM BICARBONATE 8.4 % IV SOLN
INTRAVENOUS | Status: DC | PRN
  Administered 2013-01-22: 10 mL via EPIDURAL

## 2013-01-22 MED ORDER — ACETAMINOPHEN 10 MG/ML IV SOLN
INTRAVENOUS | Status: AC
  Administered 2013-01-22: 1000 mg via INTRAVENOUS
  Filled 2013-01-22: qty 100

## 2013-01-22 MED ORDER — CHLOROPROCAINE HCL 3 % IJ SOLN
INTRAMUSCULAR | Status: AC
  Filled 2013-01-22: qty 40

## 2013-01-22 MED ORDER — FENTANYL CITRATE 0.05 MG/ML IJ SOLN
25.0000 ug | INTRAMUSCULAR | Status: DC | PRN
  Filled 2013-01-22 (×2): qty 2

## 2013-01-22 MED ORDER — OXYCODONE-ACETAMINOPHEN 5-325 MG PO TABS: 1.0000 | ORAL_TABLET | ORAL | Status: DC | PRN

## 2013-01-22 MED ORDER — DIPHENHYDRAMINE HCL 25 MG PO CAPS: 25.0000 mg | ORAL_CAPSULE | ORAL | Status: DC | PRN

## 2013-01-22 MED ORDER — AMPICILLIN SODIUM 2 G IJ SOLR
2.0000 g | Freq: Four times a day (QID) | INTRAMUSCULAR | Status: DC
  Administered 2013-01-22: 2 g via INTRAVENOUS
  Filled 2013-01-22 (×5): qty 2000

## 2013-01-22 MED ORDER — BUPIVACAINE HCL (PF) 0.5 % IJ SOLN
INTRAMUSCULAR | Status: DC | PRN
  Administered 2013-01-22: 30 mL

## 2013-01-22 MED ORDER — PHENYLEPHRINE HCL 10 MG/ML IJ SOLN
10000.0000 ug | INTRAVENOUS | Status: DC | PRN
  Administered 2013-01-22: 50 ug/min via INTRAVENOUS

## 2013-01-22 MED ORDER — ONDANSETRON HCL 4 MG/2ML IJ SOLN
INTRAMUSCULAR | Status: DC | PRN
  Administered 2013-01-22: 4 mg via INTRAVENOUS

## 2013-01-22 MED ORDER — ACETAMINOPHEN 10 MG/ML IV SOLN: 1000.0000 mg | Freq: Four times a day (QID) | INTRAVENOUS | Status: DC | PRN

## 2013-01-22 MED ORDER — ACETAMINOPHEN 10 MG/ML IV SOLN: 1000.0000 mg | Freq: Four times a day (QID) | INTRAVENOUS | Status: DC

## 2013-01-22 MED ORDER — ONDANSETRON HCL 4 MG/2ML IJ SOLN
INTRAMUSCULAR | Status: AC
  Filled 2013-01-22: qty 2

## 2013-01-22 MED ORDER — NALOXONE HCL 0.4 MG/ML IJ SOLN: 0.4000 mg | INTRAMUSCULAR | Status: DC | PRN

## 2013-01-22 MED ORDER — INSULIN ASPART 100 UNIT/ML ~~LOC~~ SOLN
0.0000 [IU] | SUBCUTANEOUS | Status: DC
  Administered 2013-01-22 – 2013-01-23 (×2): 1 [IU] via SUBCUTANEOUS
  Administered 2013-01-23: 2 [IU] via SUBCUTANEOUS
  Administered 2013-01-29: 1 [IU] via SUBCUTANEOUS

## 2013-01-22 SURGICAL SUPPLY — 44 items
BLADE SURG 10 STRL SS (BLADE) ×4 IMPLANT
CLAMP CORD UMBIL (MISCELLANEOUS) IMPLANT
CLOTH BEACON ORANGE TIMEOUT ST (SAFETY) ×4 IMPLANT
DRAIN JACKSON PRT FLT 10 (DRAIN) ×4 IMPLANT
DRAPE LG THREE QUARTER DISP (DRAPES) ×4 IMPLANT
DRSG OPSITE POSTOP 4X10 (GAUZE/BANDAGES/DRESSINGS) ×4 IMPLANT
DURAPREP 26ML APPLICATOR (WOUND CARE) ×4 IMPLANT
ELECT REM PT RETURN 9FT ADLT (ELECTROSURGICAL) ×4
ELECTRODE REM PT RTRN 9FT ADLT (ELECTROSURGICAL) ×3 IMPLANT
EVACUATOR SILICONE 100CC (DRAIN) ×4 IMPLANT
EXTRACTOR VACUUM M CUP 4 TUBE (SUCTIONS) IMPLANT
GAUZE SPONGE 4X4 12PLY STRL LF (GAUZE/BANDAGES/DRESSINGS) ×4 IMPLANT
GLOVE BIO SURGEON STRL SZ7 (GLOVE) ×4 IMPLANT
GLOVE BIOGEL PI IND STRL 7.0 (GLOVE) ×3 IMPLANT
GLOVE BIOGEL PI INDICATOR 7.0 (GLOVE) ×1
GOWN STRL REIN XL XLG (GOWN DISPOSABLE) ×8 IMPLANT
HEMOSTAT SURGICEL 2X14 (HEMOSTASIS) ×4 IMPLANT
KIT ABG SYR 3ML LUER SLIP (SYRINGE) ×4 IMPLANT
NEEDLE HYPO 22GX1.5 SAFETY (NEEDLE) ×4 IMPLANT
NEEDLE HYPO 25X5/8 SAFETYGLIDE (NEEDLE) ×4 IMPLANT
NS IRRIG 1000ML POUR BTL (IV SOLUTION) ×4 IMPLANT
PACK C SECTION WH (CUSTOM PROCEDURE TRAY) ×4 IMPLANT
PAD ABD 7.5X8 STRL (GAUZE/BANDAGES/DRESSINGS) ×4 IMPLANT
PAD OB MATERNITY 4.3X12.25 (PERSONAL CARE ITEMS) ×4 IMPLANT
RTRCTR C-SECT PINK 25CM LRG (MISCELLANEOUS) IMPLANT
SET CYSTO W/LG BORE CLAMP LF (SET/KITS/TRAYS/PACK) ×4 IMPLANT
SPONGE LAP 18X18 X RAY DECT (DISPOSABLE) ×16 IMPLANT
STAPLER VISISTAT 35W (STAPLE) IMPLANT
SUT PDS AB 0 CT1 27 (SUTURE) ×4 IMPLANT
SUT PDS AB 0 CTX 36 PDP370T (SUTURE) IMPLANT
SUT SILK 0 FSL (SUTURE) ×4 IMPLANT
SUT VIC AB 0 CT1 18XCR BRD8 (SUTURE) ×9 IMPLANT
SUT VIC AB 0 CT1 36 (SUTURE) ×8 IMPLANT
SUT VIC AB 0 CT1 8-18 (SUTURE) ×3
SUT VIC AB 0 CTX 36 (SUTURE) ×3
SUT VIC AB 0 CTX36XBRD ANBCTRL (SUTURE) ×9 IMPLANT
SUT VIC AB 3-0 SH 27 (SUTURE) ×2
SUT VIC AB 3-0 SH 27X BRD (SUTURE) ×6 IMPLANT
SUT VIC AB 4-0 KS 27 (SUTURE) ×4 IMPLANT
SYR 30ML LL (SYRINGE) ×4 IMPLANT
TAPE HYPAFIX 4 X10 (GAUZE/BANDAGES/DRESSINGS) ×4 IMPLANT
TOWEL OR 17X24 6PK STRL BLUE (TOWEL DISPOSABLE) ×12 IMPLANT
TRAY FOLEY CATH 14FR (SET/KITS/TRAYS/PACK) ×4 IMPLANT
WATER STERILE IRR 1000ML POUR (IV SOLUTION) ×4 IMPLANT

## 2013-01-22 NOTE — Brief Op Note (Addendum)
  PRE-OPERATIVE DIAGNOSES:  Failure of Descent, failed TOLAC, failed vacuum assisted vaginal delivery attempt  POST-OPERATIVE DIAGNOSES:  The same, Posterior Uterine Rupture x 2, Severe hemorrhage needing activation of massive transfusion protocol and hysterectomy  PROCEDURE:  Procedure(s): CESAREAN SECTION (N/A) HYSTERECTOMY ABDOMINAL (N/A) CYSTOSCOPY (N/A) SALPINGO OOPHORECTOMY (Right)  SURGEON:     Tereso Newcomer, MD - Primary;  Catalina Antigua, MD - Assisting  ANESTHESIA:   Epidural  INS and OUTS:  Total I/O In: 6411 [I.V.:4700; Blood:1711] Out: 5200 [Urine:600; Blood:4600]  BLOOD ADMINISTERED:3 UNITS PRBC, 2 UNITS FFP and 1 PACK PLTS  IV FLUIDS: 4700 ML  DRAINS: (10 mm) Jackson-Pratt drain(s) with closed bulb suction in the pelvis and Urinary Catheter (Foley)   DELIVERY FINDINGS: FEMALE, CEPHALIC, COPIOUS MECONIUM, ARTERIAL CORD PH 7.163, APGARS 8/9. INTACT PLACENTA, 3VC.  UTERUS VERY EDEMATOUS AND BOGGY. EDEMATOUS ADNEXA BILATERALLY.  EXTRAVASATION OF MECONIUM INTO POSTERIOR SEROSA.  LARGE POSTERIOR VERTICAL UTERINE RUPTURE ON THE LEFT INFERIOR FUNDUS; SMALL RUPTURE ALSO NOTED ON THE RIGHT SIDE. ~1000 ML OF HEMOPERITONEUM NOTED. INTACT PREVIOUS HYSTEROTOMY SITE.   LOCAL MEDICATIONS USED:  30 ML  Of 0.5% MARCAINE injected around the incision  SPECIMEN: PLACENTA, UTERUS, RIGHT OVARY AND RIGHT FALLOPIAN TUBE  DISPOSITION OF SPECIMEN:  PATHOLOGY  COUNTS:  YES  DICTATION: .Note written in EPIC  PLAN OF CARE: Admit to inpatient /AICU  PATIENT DISPOSITION:  PACU, THEN AICU  Jaynie Collins, MD, FACOG Attending Obstetrician & Gynecologist Faculty Practice, [facility] [place]

## 2013-01-22 NOTE — Progress Notes (Signed)
Friend interpreted and pt. Pointed to locations of pain in her Abd. Dr. Jolayne Panther present.  Dr. Jolayne Panther made aware of lab result and discussion with Dr. Tyson Alias, CCM. She also had discussion with Dr. Elease Hashimoto CCM.

## 2013-01-22 NOTE — Consult Note (Signed)
PULMONARY  / CRITICAL CARE MEDICINE  Name: Mackenzie [name] MRN: [id number] DOB: 1986-05-30    ADMISSION DATE:  01/21/2013 CONSULTATION DATE:  5/23  REFERRING MD :  Dr Jolayne Panther PRIMARY SERVICE: OB   CHIEF COMPLAINT:  shock  BRIEF PATIENT DESCRIPTION: 27 yr old 49 weeks pregant, STAT Csection after failed labor, uterine rupture, shock post op  SIGNIFICANT EVENTS / STUDIES:  5/23- uterine rupture, massive blood loss and prbc Tx, shock  LINES / TUBES: Left ij 5/23>>>  CULTURES:   ANTIBIOTICS: Zosyn 5/23 (concern infected amniotic)>>>  HISTORY OF PRESENT ILLNESS:  27 yr old [place] female, labor attempted failed, to OR STAT c section, uterine rupture, blood loss 4600, Tx 9 units PRBC, plat, ffp. S/p hysterectomy. Thrombocytopenia. IN AICU with continued shock, on neo. Called for assistance.   PAST MEDICAL HISTORY :  Past Medical History  Diagnosis Date  . Varicose veins   . Hx of macrosomia in infant in prior pregnancy, currently pregnant    Past Surgical History  Procedure Laterality Date  . Cesarean section     Prior to Admission medications   Medication Sig Start Date End Date Taking? Authorizing Provider  Prenatal Vit-Fe Fumarate-FA (PRENATAL MULTIVITAMIN) TABS Take 1 tablet by mouth daily at 12 noon.   Yes Historical Provider, MD   No Known Allergies  FAMILY HISTORY:  Family History  Problem Relation Age of Onset  . Alcohol abuse Neg Hx   . Arthritis Neg Hx   . Asthma Neg Hx   . Birth defects Neg Hx   . Cancer Neg Hx   . COPD Neg Hx   . Depression Neg Hx   . Diabetes Neg Hx   . Drug abuse Neg Hx   . Early death Neg Hx   . Hearing loss Neg Hx   . Heart disease Neg Hx   . Kidney disease Neg Hx   . Hypertension Neg Hx   . Hyperlipidemia Neg Hx   . Learning disabilities Neg Hx   . Mental illness Neg Hx   . Mental retardation Neg Hx   . Miscarriages / Stillbirths Neg Hx   . Stroke Neg Hx   . Vision loss Neg Hx    SOCIAL HISTORY:  reports that she has  never smoked. She has never used smokeless tobacco. She reports that she does not drink alcohol or use illicit drugs.  REVIEW OF SYSTEMS:  Unable , language barrier, shock state  SUBJECTIVE:   VITAL SIGNS: Temp:  [97.7 F (36.5 C)-102 F (38.9 C)] 101 F (38.3 C) (05/23 1715) Pulse Rate:  [42-147] 68 (05/23 1715) Resp:  [0-34] 24 (05/23 1715) BP: (52-130)/(29-91) 76/42 mmHg (05/23 1715) SpO2:  [80 %-100 %] 91 % (05/23 1330) Arterial Line BP: (65-139)/(29-76) 139/72 mmHg (05/23 1715) HEMODYNAMICS:   VENTILATOR SETTINGS:   INTAKE / OUTPUT: Intake/Output     05/22 0701 - 05/23 0700 05/23 0701 - 05/24 0700   I.V. (mL/kg)  5200 (74.9)   Blood  2646   Other  80   Total Intake(mL/kg)  7926 (114.2)   Urine (mL/kg/hr) 1000 (0.6) 1200 (1.5)   Drains  205 (0.3)   Blood  4600 (5.8)   Total Output 1000 6005   Net -1000 +1921          PHYSICAL EXAMINATION: General:  No distress Neuro:  nonfocal exam HEENT:  No JVD at all Cardiovascular:  s1 s2 rrt disant Lungs: ANterior clear Abdomen:  Distended abdo, post csection delivery, limited to  no BS Skin:  No rash  LABS:  Recent Labs Lab 01/21/13 0745 01/22/13 1210 01/22/13 1240 01/22/13 1349  HGB 13.5  --  9.1* 8.0*  WBC 11.5*  --  19.6* 14.4*  PLT 126* 114* 114* 67*  NA  --  135  --   --   K  --  3.9  --   --   CL  --  103  --   --   CO2  --  24  --   --   GLUCOSE  --  104*  --   --   BUN  --  8  --   --   CREATININE  --  0.84  --   --   CALCIUM  --  7.8*  --   --   APTT  --  27  --   --   INR  --  1.20  --   --    No results found for this basename: GLUCAP,  in the last 168 hours  CXR: none  ASSESSMENT / PLAN:  PULMONARY A: post op, at risk ATX, at risk TRALI P:   abdo distended, sit upright STAT pcxr  CARDIOVASCULAR A: Shock, likely SIRS, massive Tx P:  Stat lactic acid Place line STAT cvp now Neo failing, transition to levophed now to MAP  65 12 lead assess tachy, ensure ST May need  svo2  RENAL A:  R.o ATN P:   Stat chemistry Lactic  Dc LR, change to saline  GASTROINTESTINAL A:  npo P:   Add ppi  HEMATOLOGIC A:  S/p massive blood loss and TX, likely consumptiveness thrombocytopenia and diltuion P:  Appears dry, await cvp, would prefer resusciation with prbc when indicated, limit when able as can worsen dilution STAT cbc pending Repeat cpags for worsening coagulapthy Cbc q4h Repeat fibrinogen  INFECTIOUS A:  Concern GYN infection amniotic P:   Per GYN  ENDOCRINE A:  R/o rel AI Add cbg P:   Stat cortisol Add cbg  NEUROLOGIC A:  pain P:   Dc dilauded in shock, add fentanyl  TODAY'S SUMMARY: Line placed, pressors, stat cbc, coags, maintain MAp 65, awiat cbc, chanbe to levophed, obtain cortisol  I have personally obtained a history, examined the patient, evaluated laboratory and imaging results, formulated the assessment and plan and placed orders. CRITICAL CARE: The patient is critically ill with multiple organ systems failure and requires high complexity decision making for assessment and support, frequent evaluation and titration of therapies, application of advanced monitoring technologies and extensive interpretation of multiple databases. Critical Care Time devoted to patient care services described in this note is 60 minutes.   Mcarthur Rossetti. Tyson Alias, MD, FACP Pgr: [phone] [facility]  Pulmonary and Critical Care Medicine [facility] Pager: [phone]  01/22/2013, 6:26 PM

## 2013-01-22 NOTE — Progress Notes (Signed)
Faculty Practice OB/GYN Attending Note  Subjective:  Called to evaluate patient with prolonged 2nd stage.  She was fully dilated around 2230 on 01/21/13.  She labored down for a few hours and started pushing around 0330 this morning. Currently, cervical exam is 10/100/+2, LOA with some molding and caput. No change in station from 0330 despite four hours of pushing. Patient also has chorioamnionitis and received Ampicillin and Gentamicin;   FHR baseline is 170s with moderate variability, no accelerations, early and variable decels present.  Contractions 2-3 minutes.  Good FM.   Admitted on 01/21/2013 for IOL/TOLAC secondary to postdates [redacted]w[redacted]d.   Objective:  Blood pressure 130/54, pulse 93, temperature 100.5 F (38.1 C), temperature source Axillary, resp. rate 22, height 5\' 1"  (1.549 m), weight 153 lb (69.4 kg), last menstrual period 03/22/2012, SpO2 99.00%. Gen: NAD Abdomen: NT gravid fundus, soft Cervix:As above Ext: 2+ DTRs, no edema, no cyanosis, negative Homan's sign  In Room Events Patient is [place] speaking only, [place] interpreter used for this encounter.  Recommended operative vaginal delivery versus cesarean section.  Risks and benefits discussed in detail.  Risks of vacuum assisted SVD that were discussed included but are not limited to: bleeding, infection, damage to maternal tissues, fetal cephalhematoma.  There is also the risk of inability to effect vaginal delivery of the head, or shoulder dystocia that cannot be resolved by established maneuvers, leading to the need for emergency cesarean section.  The risks of cesarean section were also discussed with the patient including but were not limited to: bleeding which may require transfusion or reoperation; infection which may require antibiotics; injury to bowel, bladder, ureters or other surrounding organs; injury to the fetus; need for additional procedures including hysterectomy in the event of a life-threatening hemorrhage; placental  abnormalities wth subsequent pregnancies, incisional problems, thromboembolic phenomenon and other postoperative/anesthesia complications. Patient gave verbal consent for the vacuum assisted delivery.    Vacuum assistance note The soft vacuum soft cup was positioned over the sagittal suture 3 cm anterior to posterior fontanelle.  Pressure was then increased to 500 mmHg, and the patient was instructed to push.  Pulling was administered along the pelvic curve.  3 pulls were administered; no good pushing effort noted.  No further descent was noted. Vacuum assistance was discontinued, recommended proceeding with cesarean section.  Patient gave written consent for cesarean section. OR and Anesthesia aware.    Assessment & Plan:  27 y.o. G3P1011 at [redacted]w[redacted]d admitted for IOL/TOLAC for postdates, now with prolonged second stage and failed operative vaginal delivery; patient also has chorioamnionitis.  Will proceed with cesarean section.  To OR when ready.  Jaynie Collins, MD, FACOG Attending Obstetrician & Gynecologist Faculty Practice, [facility]

## 2013-01-22 NOTE — Progress Notes (Addendum)
eLink Physician-Brief Progress Note Patient Name: Mackenzie [name] DOB: August 30, 1986 MRN: [id number]  Date of Service  01/22/2013   HPI/Events of Note   Patient still on pressors.   Recent Labs Lab 01/21/13 0745 01/22/13 1240 01/22/13 1349 01/22/13 1745 01/22/13 2020  HGB 13.5 9.1* 8.0* 7.4* 6.7*   8.20mg  hgb is 6.7gm%  eICU Interventions  Ongoing active hge suspected  pllan Give 2 Unit PRBC RN instructed to call OB for possible re-explorations  Addendum 9:25 PM  - OB attending called . Feels patient bleeding ? Settling and not in need for re-exploration.  - Elink and CCM will continue to offer medical mgmt    Intervention Category Major Interventions: Hemorrhage - evaluation and management  Ehab Humber 01/22/2013, 8:51 PM

## 2013-01-22 NOTE — Progress Notes (Signed)
Patient lying in bed complaining of back pain and LUQ pain. Patient denies chest pain, SOB, lightheadedness/dizziness.   Filed Vitals:   01/22/13 2058 01/22/13 2100 01/22/13 2115 01/22/13 2120  BP:      Pulse:  116 123 136  Temp: 99.9 F (37.7 C)     TempSrc:      Resp:  29 29 16   Height:      Weight:      SpO2:       I/O last 3 completed shifts: In: 7897.7 [I.V.:5201.7; ZOXWR:6045; IV Piggyback:50] Out: 7305 [Urine:2260; Drains:445; Blood:4600]   GENERAL: Well-developed, well-nourished female in no acute distress.  LUNGS: Clear to auscultation bilaterally.  ABDOMEN: Soft, nontender, softly distended. Unchanged from earlier exam Incision: dressing in place moderately stained EXTREMITIES: No cyanosis, clubbing, or edema, 2+ distal pulses.  CBC    Component Value Date/Time   WBC 18.4* 01/22/2013 2020   RBC 2.40* 01/22/2013 2020   HGB 6.7* 01/22/2013 2020   HCT 18.9* 01/22/2013 2020   PLT 112* 01/22/2013 2020   MCV 78.8 01/22/2013 2020   MCH 27.9 01/22/2013 2020   MCHC 35.4 01/22/2013 2020   RDW 16.3* 01/22/2013 2020    A/P Patient POD#0 s/p cesarean with hysterectomy - patient was restarted on pressors  - patient currently being transfused 2 more units pRBC - JP drain actually putting out less volume than earlier - I do not believe that there is active on going hemorrhage at this time - Case discussed with consulting critical care team - Continue to monitor closely and f/u on post transfusion cbc

## 2013-01-22 NOTE — Progress Notes (Signed)
Update obtained from bedside nurse; over the past 4 h the pressors have been titrated up, the HR has increased and there has been between 35 to 50 cc/h bloody discharge from the JP drain; the surgical site dressing with bloody; urine output decreased and now 0 over the past 2h.   The patient is currently receiving PRBC;   Plan: repeat labs including Cr, K, LFTs, troponin, EKG; PT, INR, PTT and lactic acid.   CBC post transfusion.

## 2013-01-22 NOTE — Consult Note (Signed)
ANTIBIOTIC CONSULT NOTE - INITIAL  Pharmacy Consult for Gentamicin Indication: Maternal temperature/chorioamnionitis  No Known Allergies  Patient Measurements: Height: 5\' 1"  (154.9 cm) Weight: 153 lb (69.4 kg) IBW/kg (Calculated) : 47.8 Adjusted Body Weight: 54.3 kg  Vital Signs: Temp: 101.1 F (38.4 C) (05/23 0301) Temp src: Oral (05/23 0301) BP: 123/91 mmHg (05/23 0332) Pulse Rate: 81 (05/23 0332) Intake/Output from previous day: 05/22 0701 - 05/23 0700 In: -  Out: 700 [Urine:700] Intake/Output from this shift: Total I/O In: -  Out: 700 [Urine:700]  Labs:  Recent Labs  01/21/13 0745  WBC 11.5*  HGB 13.5  PLT 126*   CrCl is unknown because no creatinine reading has been taken. No results found for this basename: VANCOTROUGH, VANCOPEAK, VANCORANDOM, GENTTROUGH, GENTPEAK, GENTRANDOM, TOBRATROUGH, TOBRAPEAK, TOBRARND, AMIKACINPEAK, AMIKACINTROU, AMIKACIN,  in the last 72 hours   Microbiology: No results found for this or any previous visit (from the past 720 hour(s)).  Medical History: Past Medical History  Diagnosis Date  . Varicose veins   . Hx of macrosomia in infant in prior pregnancy, currently pregnant     Medications:  Ampicillin 2 gm IV every 6 hours  Assessment: 27 yo G3P1011 admitted 01/21/13 for induction of labor due to post dates. Pt now has increased temp to 101.1 in labor and presumed chorioamnionitis.  Goal of Therapy:  Gentamicin peaks 6-8 mcg/ml; troughs <1 mcg/ml   Plan:  Gentamicin loading dose 150 mg IV. Gentamicin maintenance dose 140 mg IV every 8 hours. We will monitor renal function with serum creatinine per protocol. Gentamicin levels as indicated.   Arelia Sneddon 01/22/2013,3:47 AM

## 2013-01-22 NOTE — OR Nursing (Signed)
Received from or at 11:35 pt alert skiin warm dry pink resp even and unlabored. Interpretor interprtets pt says she is comfortable. assymptic with low b/p. Anesthesia notified of low b/p. Orders received to hand 2 units blood. 2nd unit blood hung at 12:13. Orders to hang ffp . Fever 102. Iv tylenol hung at 12:37. 13:25 2 units prbc orderd and 1 unit ffp. Blood hung and infused at 999 per hour. Temp 100.4. Cbc ordered after blood and ffp infused. 2nd unit ffp in at 1345. Calcium gluconate ordered to be given infuse over 15 minutes ordered at 14:03.   14:26 dr Cristela Blue orders 2 units rbc and one unit ffp and one unit platelets. 2 units blood in at 14:38. ffp infused and platelets infused.    collen rhymer crna and dana crna involved in pt care . Dr foster and dr Cristela Blue actively monitors pt. Dr Jolayne Panther assesses pt at approx  3pm.  Dr foster updates dr Jolayne Panther. Pt b/p start looking more stable. Pt always alert and states comfortable during pacu stay. Denies pain . Margarita Mail rn

## 2013-01-22 NOTE — Progress Notes (Signed)
Patient in PACU lying comfortably in Trendelenburg with infant at her side. Patient is without any complaints; she denies feeling dizzy or lightheaded. She denies abdominal pain.  Filed Vitals:   01/22/13 1315 01/22/13 1330 01/22/13 1345 01/22/13 1424  BP: 73/44 75/37 80/48    Pulse: 131 122    Temp:  100.4 F (38 C)  98.4 F (36.9 C)  TempSrc:    Oral  Resp: 8 7 11    Height:      Weight:      SpO2: 97% 91%     Total I/O In: 7546 [I.V.:5200; Blood:2346] Out: 5765 [Urine:1050; Drains:115; Blood:4600]   GENERAL: Well-developed, well-nourished female in no acute distress.  ABDOMEN: Soft, nontender, softly distended. Dressing: minimally stained EXTREMITIES: No cyanosis, clubbing, or edema, 2+ distal pulses.  CBC    Component Value Date/Time   WBC 14.4* 01/22/2013 1349   RBC 2.92* 01/22/2013 1349   HGB 8.0* 01/22/2013 1349   HCT 23.7* 01/22/2013 1349   PLT 67* 01/22/2013 1349   MCV 81.2 01/22/2013 1349   MCH 27.4 01/22/2013 1349   MCHC 33.8 01/22/2013 1349   RDW 15.9* 01/22/2013 1349    A/P 27 yo G2P2 POD#0 s/p abdominal hysterectomy at the time of cesarean section - Vitals signs maintained on neo-synephrine. Continue to wean off - Hg stable at 8. It was 9.1 at 12:40 - In view of thrombocytopenia. Will transfuse more platelets, FFP and pRBC - Transfer to ICU when BP maintained without neo-synephrine - Continue to monitor closely

## 2013-01-22 NOTE — Progress Notes (Signed)
Mackenzie [name] is a 27 y.o. G3P1011 at [redacted]w[redacted]d by admitted for induction of labor due to Post dates.  Subjective: Comfortable with epidural  Objective: BP 123/91  Pulse 81  Temp(Src) 100.9 F (38.3 C) (Axillary)  Resp 18  Ht 5\' 1"  (1.549 m)  Wt 69.4 kg (153 lb)  BMI 28.92 kg/m2  SpO2 99%  LMP 03/22/2012   Total I/O In: -  Out: 700 [Urine:700]  TMax 101.1  Has had tylenol and a dose each of ampicillin and gentamycin  FHT: 170's, avg LTV, mild variable decels when pushing; FSE does not demonstrate a drop in FHR, but appears as artifact SVE:   Dilation: 10 Effacement (%): 100 Station: +2 Exam by:: T. Sprague RN She has been pushing for about an hour, with caput visible when pushing, but vtx @ +2.  Pt cannot feel any pressure  Labs: Lab Results  Component Value Date   WBC 11.5* 01/21/2013   HGB 13.5 01/21/2013   HCT 38.9 01/21/2013   MCV 90.5 01/21/2013   PLT 126* 01/21/2013    Assessment / Plan: Induction of labor due to post dates, slow 2nd stage Chorioamnionitis Will cut epidural rate in half, resume pushing in about 30 minutes  COntinue antibiotics Labor: Progressing normally Fetal Wellbeing:  Category II Pain Control:  Epidural Anticipated MOD:  NSVD  CRESENZO-DISHMAN,Garrette Caine 01/22/2013, 4:51 AM  Id examined this patient and agree the above assessment. CRESENZO-DISHMAN,Jamelle Noy 01/22/2013 4:51 AM

## 2013-01-22 NOTE — Progress Notes (Signed)
Vacuum unsuccessful.  Interpreter used [phone] via [place]. Pt understands need to have repeat C/S.  Risks reviewed with pt and FOB through interpreter.  Pt verbalizes understanding.

## 2013-01-22 NOTE — Op Note (Signed)
Francys Lawh Dannenberg PROCEDURE DATE: 01/22/2013  PREOPERATIVE DIAGNOSES: Intrauterine pregnancy at [redacted]w[redacted]d weeks gestation; previous cesarean section; failed IOL for postdates; failed TOLAC; arrest of descent; chorioamnionitis; failed vacuum assisted vaginal delivery attempt  POST-OPERATIVE DIAGNOSES: The same; posterior uterine rupture x 2; severe hemorrhage needing activation of massive transfusion protocol and hysterectomy; coagulopathy.   PROCEDURES:  CESAREAN SECTION SUPRACERVICAL HYSTERECTOMY RIGHT SALPINGOOPHORECTOMY CYSTOSCOPY  SURGEONS: Tereso Newcomer, MD - Primary; Catalina Antigua, MD - Assisting   ANESTHESIOLOGIST: Dr. Cristela Blue  ANESTHESIA: Epidural and 30 ml of 0.5% Marcaine injected subcutaneously around the incision at the end of the surgery.  INDICATIONS: Mackenzie [name] is a 27 y.o. [id number] at [redacted]w[redacted]d here for repeat cesarean section secondary to the indications listed under preoperative diagnoses; please see preoperative note for further details.  The risks of repeat cesarean section were discussed with the patient including but were not limited to: bleeding which may require transfusion or reoperation; infection which may require antibiotics; injury to bowel, bladder, ureters or other surrounding organs; injury to the fetus; need for additional procedures including hysterectomy in the event of a life-threatening hemorrhage; placental abnormalities wth subsequent pregnancies, incisional problems, thromboembolic phenomenon and other postoperative/anesthesia complications.   The patient concurred with the proposed plan, giving informed written consent for the procedure.  Of note, a [place] phone [place] interpreter was used for the consenting process.  INS and OUTS: In: 6411 [I.V.:4700; Blood:1711]   Out: 5200 [Urine:600; Blood:4600]   BLOOD PRODUCTS ADMINISTERED DURING SURGERY:  3 UNITS PRBC, 2 UNITS FFP and 1 PACK PLTS   DRAINS: (10 mm) Jackson-Pratt drain(s) placed in the pelvis  with closed bulb suction  and Urinary Catheter (Foley)   FINDINGS: VIABLE FEMALE INFANT, CEPHALIC PRESENTATION, COPIOUS MECONIUM, ARTERIAL CORD PH 7.163, APGARS 8/9. INTACT PLACENTA, 3VC.  ~1000 ML OF HEMOPERITONEUM NOTED UPON ENTRY INTO THE ABDOMEN. INTACT PREVIOUS HYSTEROTOMY SITE.  UTERUS NOTED TO BE VERY EDEMATOUS AND BOGGY. EDEMATOUS ADNEXA BILATERALLY. DIFFUSE EXTRAVASATION OF MECONIUM INTO POSTERIOR SEROSA. LARGE POSTERIOR VERTICAL UTERINE RUPTURE ON THE LEFT INFEROLATERAL FUNDUS; SMALL RUPTURE ALSO NOTED ON THE RIGHT POSTERIOR SIDE. BILATERAL URETERAL JETS NOTED DURING CYSTOSCOPY.  SPECIMENS: PLACENTA, UTERUS, RIGHT OVARY AND RIGHT FALLOPIAN TUBE SENT TO PATHOLOGY   COMPLICATIONS: POSTERIOR UTERINE RUPTURE; SEVERE HEMORRHAGE NEEDING ACTIVATION OF MASSIVE TRANSFUSION PROTOCOL AND HYSTERECTOMY.  PROCEDURE IN DETAIL:  The patient preoperatively received intravenous antibiotics and had sequential compression devices applied to her lower extremities.  She was then taken to the operating room where epidural anesthesia was administered and was found to be adequate. She was then placed in a dorsal supine position with a leftward tilt, and prepped and draped in a sterile manner.  A foley catheter was placed into her bladder and attached to constant gravity.  After an adequate timeout was performed, a Pfannenstiel skin incision was made with scalpel over her preexisting scar, and carried through to the underlying layer of fascia. The fascia was incised in the midline, and this incision was extended bilaterally using the Mayo scissors.  Kocher clamps were applied to the superior aspect of the fascial incision and the underlying rectus muscles were dissected off bluntly. A similar process was carried out on the inferior aspect of the fascial incision. The rectus muscles were separated in the midline bluntly and the peritoneum was entered bluntly.  Significant (~1000 ml) was immediately noted which was concerning  for uterine rupture.  Attention was turned to the lower uterine segment which was found to be intact; no uterine rupture  site was noted on examination of the anterior uterus.  A low transverse hysterotomy was made with a scalpel and extended bilaterally bluntly. Copious amount of meconium was noted.  The infant was successfully delivered, the cord was clamped and cut and the infant was handed over to awaiting neonatology team. Uterine massage was then administered, and the placenta delivered intact with a three-vessel cord. The uterus was then exteriorized and a large 6 cm low vertical inferolateral uterine rupture was noted on the posterior fundus and lower uterine segment.   There was also a 2 cm uterine rupture noted in the middle portion of the right side of the posterior fundus.  There was also an extension of the anterior hysterotomy inferiorly. The uterus was noted to be very boggy and edematous; the mesosalpinges and adnexa were also very edematous. There was also diffuse extravasation of meconium into the serosa of the posterior fundus.  The uterus was cleared of clot and debris.  The hysterotomy and the extension was closed with 0 Vicryl in a running locked fashion, and an imbricating layer was also placed with 0 Vicryl.  A similar process was carried out on the uterine rupture sites of the posterior fundus but the stitches were noted to pull through the edematous tissue resulting in further bleeding.  Given the amount of hemoperitoneum noted and ongoing blood loss from the rupture sites and uterine atony, massive transfusion protocol was activated.  Patient also received a bolus of LR with 40 units of pitocin and two doses of IM methergine which did not help with uterine atony. More significant bleeding occurred and the blood was noted to be getting thinner in viscosity concerning for coagulopathy.  Given that she had lost over 3000 ml of blood at this point and the bleeding could not be controlled, the  decision was made to proceed with hysterectomy.     The bowel was packed away with moist laparotomy sponges. The round ligaments on each side were clamped, cut and suture ligated with 0 Vicryl,  allowing entry into the broad ligament. Of note, all sutures used in this part of the procedure are 0 Vicryl unless otherwise noted.  Adnexae were clamped on the patient's right side, cut, and doubly suture ligated. This procedure was repeated in an identical fashion on the left site allowing for both adnexa to remain in place.   However, there was significant bleeding from the right adnexa pedicle which could not be controlled with sutures.  The right infundibulopelvic ligament was clamped, cut, and doubly suture ligated resulting in right salpingoophorectomy.   A bladder flap was then created and the bladder was then bluntly dissected off the lower uterine segment and cervix.  The broad ligaments were serially clamped, cut and suture ligated bilaterally.  The uterine vessels were then clamped, cut, and doubly suture ligated with care given to prevent ureteral injury.  The uterus was then amputated across the lower uterine segment leaving the cervix in place. The anterior and posterior peritoneal edges were then reapproximated in the midline over the cervical stump without complication, however there was significant edema and diffuse oozing from all surfaces.  At this point, patient was already receiving several blood products.  The pelvis was irrigated, and some pedicles were further secured with 0 Vicryl sutures.  Surgicel was placed over the closed cervical stump and on the pedicles.  A 10 mm JP drain was placed in the pelvis and brought out through the abdominal wall on the right side of  the Pfannenstiel incision; this was connected to closed bulb suction and secured with a 0 Silk suture.  The peritoneum and the muscles were reapproximated using 0 Vicryl interrupted stitches. The fascia was then closed using 0 PDS in  a running fashion.  The subcutaneous layer was irrigated, and 30 ml of 0.5% Marcaine was injected subcutaneously around the incision.  The skin was closed with staples.  Cystoscopy was performed which showed bilateral ureteral jets after intravenous indigo carmine administration.  Sponge, lap, instrument and needle counts were correct several times during the surgery.  Patient was then taken to the recovery room.  Will continue to administer blood products as needed and monitor JP output in the PACU.  After observation in the PACU, patient will go the AICU and Critical Care Medicine will be consulted to aid in the care of this patient.

## 2013-01-22 NOTE — Transfer of Care (Signed)
Immediate Anesthesia Transfer of Care Note  Patient: Mackenzie [name]  Procedure(s) Performed: Procedure(s): CESAREAN SECTION (N/A) HYSTERECTOMY ABDOMINAL (N/A) CYSTOSCOPY (N/A) SALPINGO OOPHORECTOMY (Right)  Patient Location: PACU  Anesthesia Type:Epidural  Level of Consciousness: awake  Airway & Oxygen Therapy: Patient Spontanous Breathing  Post-op Assessment: Report given to PACU RN  Post vital signs: Reviewed and stable  Complications: No apparent anesthesia complications

## 2013-01-22 NOTE — Progress Notes (Signed)
I offered support to family and patient with the help of an interpreter.  I helped bring family to consult room and waited with them while we awaited an update from staff.  Family was appreciative of support and help.  [address] [place] Pager, [phone] 4:11 PM   01/22/13 1600  Clinical Encounter Type  Visited With Patient and family together  Visit Type Spiritual support  Referral From Nurse

## 2013-01-22 NOTE — Procedures (Signed)
Central Venous Catheter Insertion Procedure Note Mackenzie [name] [id number] 1986-02-15  Procedure: Insertion of Central Venous Catheter Indications: Assessment of intravascular volume, Drug and/or fluid administration and Frequent blood sampling  Procedure Details Consent: Risks of procedure as well as the alternatives and risks of each were explained to the (patient/caregiver).  Consent for procedure obtained. Time Out: Verified patient identification, verified procedure, site/side was marked, verified correct patient position, special equipment/implants available, medications/allergies/relevent history reviewed, required imaging and test results available.  Performed  Maximum sterile technique was used including antiseptics, cap, gloves, gown, hand hygiene, mask and sheet. Skin prep: Chlorhexidine; local anesthetic administered A antimicrobial bonded/coated triple lumen catheter was placed in the left internal jugular vein using the Seldinger technique.  Evaluation Blood flow good Complications: No apparent complications Patient did tolerate procedure well. Chest X-ray ordered to verify placement.  CXR: pending.  Nelda Bucks 01/22/2013, 6:24 PM  [employer] [place] gudiance

## 2013-01-22 NOTE — Anesthesia Postprocedure Evaluation (Signed)
  Anesthesia Post-op Note  Patient: Mackenzie [name]  Procedure(s) Performed: Procedure(s): CESAREAN SECTION (N/A) HYSTERECTOMY ABDOMINAL (N/A) CYSTOSCOPY (N/A) SALPINGO OOPHORECTOMY (Right)  Patient Location: PACU  Anesthesia Type:Epidural  Level of Consciousness: awake, alert  and oriented  Airway and Oxygen Therapy: Patient Spontanous Breathing  Post-op Pain: none  Post-op Assessment: Post-op Vital signs reviewed, Patient's Cardiovascular Status Stable, Respiratory Function Stable, Patent Airway, No signs of Nausea or vomiting, Pain level controlled, No headache and No backache. Patient received 9 units of PRBCs, 5 units of FFP and 1 unit of pheresis platelets in OR+PACU. BP has been labile with hypotension requiring pressors. A-line inserted in PACU for monitoring and blood sampling.She has had 400cc of urine output. She is currently off pressors and stable for discharge to AICU. Critical Care will follow.  Post-op Vital Signs: Reviewed and stable  Complications: No apparent anesthesia complications

## 2013-01-22 NOTE — Progress Notes (Signed)
Asked pt. About pain. Friend in room interpreted and pt. pointed

## 2013-01-22 NOTE — Procedures (Signed)
Arterial Catheter Insertion Procedure Note Hinata Detria Cummings [id number] 1986-05-13  Procedure: Insertion of Arterial Catheter  Indications: Blood pressure monitoring and Frequent blood sampling  Procedure Details Consent: Risks of procedure as well as the alternatives and risks of each were explained to the (patient/caregiver).  Consent for procedure obtained. Time Out: Verified patient identification, verified procedure, site/side was marked, verified correct patient position, special equipment/implants available, medications/allergies/relevent history reviewed, required imaging and test results available.  Performed  Maximum sterile technique was used including antiseptics, cap, gloves, gown, hand hygiene, mask and sheet. Skin prep: Chlorhexidine; local anesthetic administered 20 gauge catheter was inserted into left radial artery using the Seldinger technique.  Evaluation Blood flow Unsuccessful attempt; BP tracing Unsuccessful attempt. Complications: Complications of got a flash of blood, but unable to thread catheter into artery.   Harlin Heys 01/22/2013

## 2013-01-22 NOTE — Progress Notes (Signed)
Dr. Adah Perl at the bedside and burmese interpreter on the phone. Risk and benefits of vacuum assisted delivery and cesarean section delivery discussed and pt states understanding. Pt signed informed consent.

## 2013-01-22 NOTE — OR Nursing (Signed)
approx 1600 pt systolic pressure drops to 60's pt assymptomatic. Call to dr foster. Orders received to restart the neo drip at and to give 80 mcg bolus . Orders given to transport to aicu. And any further orders are to be given by critical care service as they will take over care of patient. Pt transported to aicu report given to kristin and teresa rn.Melanny Wire rn

## 2013-01-22 NOTE — Progress Notes (Signed)
Mackenzie [name] is a 27 y.o. G3P1011 at [redacted]w[redacted]d admitted for induction of labor due to Post dates.  Subjective: RN noticed more frequent drop in Fetal Heart Rate.  Mother comfortable with epidural.  Objective: BP 97/62  Pulse 76  Temp(Src) 99.7 F (37.6 C) (Oral)  Resp 16  Ht 5\' 1"  (1.549 m)  Wt 69.4 kg (153 lb)  BMI 28.92 kg/m2  SpO2 99%  LMP 03/22/2012   Total I/O In: -  Out: 700 [Urine:700]  FHT:  FHR: 150 bpm, variability: moderate,  accelerations:  Present,  decelerations:  Present Periods where fetal HR drops to the 50's secondary to arrythmia UC:   regular, every 2-3 minutes SVE:   Dilation: 10 Effacement (%): 100 Station: +1 Exam by:: F. Cresenzo CNM  Labs: Lab Results  Component Value Date   WBC 11.5* 01/21/2013   HGB 13.5 01/21/2013   HCT 38.9 01/21/2013   MCV 90.5 01/21/2013   PLT 126* 01/21/2013    Assessment / Plan: Induction of labor due to post dates, progressing well on pitocin. Given persistent/frequent drops in FHR secondary to arrythmia, fetal scalp electrode placed.  Labor: Progressing normally Fetal Wellbeing:  Category II Pain Control:  Epidural Anticipated MOD:  NSVD  [name] Other 01/22/2013, 2:06 AM

## 2013-01-23 ENCOUNTER — Encounter (HOSPITAL_COMMUNITY): Payer: Self-pay | Admitting: Anesthesiology

## 2013-01-23 ENCOUNTER — Encounter (HOSPITAL_COMMUNITY): Payer: Self-pay

## 2013-01-23 ENCOUNTER — Inpatient Hospital Stay (HOSPITAL_COMMUNITY): Payer: Medicaid Other

## 2013-01-23 ENCOUNTER — Inpatient Hospital Stay (HOSPITAL_COMMUNITY): Payer: Medicaid Other | Admitting: Anesthesiology

## 2013-01-23 ENCOUNTER — Other Ambulatory Visit (HOSPITAL_COMMUNITY): Payer: Medicaid Other

## 2013-01-23 ENCOUNTER — Encounter (HOSPITAL_COMMUNITY): Payer: Self-pay | Admitting: Certified Registered Nurse Anesthetist

## 2013-01-23 ENCOUNTER — Encounter (HOSPITAL_COMMUNITY)
Admission: RE | Disposition: A | Discharge status: Home / Self Care | Payer: Self-pay | Source: Ambulatory Visit | Attending: Critical Care Medicine

## 2013-01-23 ENCOUNTER — Inpatient Hospital Stay (HOSPITAL_COMMUNITY): Payer: Medicaid Other | Admitting: Certified Registered Nurse Anesthetist

## 2013-01-23 DIAGNOSIS — T794XXA Traumatic shock, initial encounter: Secondary | ICD-10-CM

## 2013-01-23 DIAGNOSIS — K661 Hemoperitoneum: Secondary | ICD-10-CM

## 2013-01-23 DIAGNOSIS — D65 Disseminated intravascular coagulation [defibrination syndrome]: Secondary | ICD-10-CM | POA: Diagnosis not present

## 2013-01-23 HISTORY — PX: LAPAROTOMY: SHX154

## 2013-01-23 LAB — CBC WITH DIFFERENTIAL/PLATELET
Basophils Absolute: 0 10*3/uL (ref 0.0–0.1)
Basophils Absolute: 0 10*3/uL (ref 0.0–0.1)
Basophils Absolute: 0 10*3/uL (ref 0.0–0.1)
Basophils Relative: 0 % (ref 0–1)
Basophils Relative: 0 % (ref 0–1)
Eosinophils Absolute: 0 10*3/uL (ref 0.0–0.7)
Eosinophils Absolute: 0 10*3/uL (ref 0.0–0.7)
Eosinophils Absolute: 0 10*3/uL (ref 0.0–0.7)
Eosinophils Relative: 0 % (ref 0–5)
HCT: 17.7 % — ABNORMAL LOW (ref 36.0–46.0)
Hemoglobin: 9.9 g/dL — ABNORMAL LOW (ref 12.0–15.0)
Lymphocytes Relative: 19 % (ref 12–46)
Lymphs Abs: 2.8 10*3/uL (ref 0.7–4.0)
MCH: 29.8 pg (ref 26.0–34.0)
MCH: 29.5 pg (ref 26.0–34.0)
MCHC: 35.6 g/dL (ref 30.0–36.0)
MCHC: 36.1 g/dL — ABNORMAL HIGH (ref 30.0–36.0)
MCHC: 36.3 g/dL — ABNORMAL HIGH (ref 30.0–36.0)
MCV: 81.9 fL (ref 78.0–100.0)
MCV: 82.6 fL (ref 78.0–100.0)
Monocytes Absolute: 1.1 10*3/uL — ABNORMAL HIGH (ref 0.1–1.0)
Neutro Abs: 10 10*3/uL — ABNORMAL HIGH (ref 1.7–7.7)
Neutrophils Relative %: 72 % (ref 43–77)
Neutrophils Relative %: 81 % — ABNORMAL HIGH (ref 43–77)
Platelets: 70 10*3/uL — ABNORMAL LOW (ref 150–400)
Platelets: DECREASED 10*3/uL (ref 150–400)
Platelets: 105 10*3/uL — ABNORMAL LOW (ref 150–400)
RDW: 15.5 % (ref 11.5–15.5)
RDW: 14.5 % (ref 11.5–15.5)
RDW: 14.1 % (ref 11.5–15.5)
WBC Morphology: INCREASED
WBC: 14.5 10*3/uL — ABNORMAL HIGH (ref 4.0–10.5)

## 2013-01-23 LAB — POCT I-STAT 3, ART BLOOD GAS (G3+)
O2 Saturation: 100 %
O2 Saturation: 100 %
Patient temperature: 99.8
Patient temperature: 102.4

## 2013-01-23 LAB — GLUCOSE, CAPILLARY
Glucose-Capillary: 112 mg/dL — ABNORMAL HIGH (ref 70–99)
Glucose-Capillary: 123 mg/dL — ABNORMAL HIGH (ref 70–99)
Glucose-Capillary: 151 mg/dL — ABNORMAL HIGH (ref 70–99)

## 2013-01-23 LAB — COMPREHENSIVE METABOLIC PANEL
ALT: 9 U/L (ref 0–35)
Albumin: 1.5 g/dL — ABNORMAL LOW (ref 3.5–5.2)
Albumin: 1.4 g/dL — ABNORMAL LOW (ref 3.5–5.2)
Alkaline Phosphatase: 44 U/L (ref 39–117)
BUN: 16 mg/dL (ref 6–23)
BUN: 19 mg/dL (ref 6–23)
Calcium: 7.1 mg/dL — ABNORMAL LOW (ref 8.4–10.5)
Chloride: 104 mEq/L (ref 96–112)
Creatinine, Ser: 0.87 mg/dL (ref 0.50–1.10)
Creatinine, Ser: 0.88 mg/dL (ref 0.50–1.10)
GFR calc Af Amer: >90 mL/min (ref >90)
GFR calc non Af Amer: 89 mL/min — ABNORMAL LOW (ref >90)
Glucose, Bld: 162 mg/dL — ABNORMAL HIGH (ref 70–99)
Potassium: 4.1 mEq/L (ref 3.5–5.1)
Sodium: 131 mEq/L — ABNORMAL LOW (ref 135–145)
Sodium: 132 mEq/L — ABNORMAL LOW (ref 135–145)
Total Bilirubin: 0.9 mg/dL (ref 0.3–1.2)
Total Protein: 3.5 g/dL — ABNORMAL LOW (ref 6.0–8.3)
Total Protein: 3.7 g/dL — ABNORMAL LOW (ref 6.0–8.3)

## 2013-01-23 LAB — BLOOD GAS, ARTERIAL
Bicarbonate: 21 mEq/L (ref 20.0–24.0)
pH, Arterial: 7.457 — ABNORMAL HIGH (ref 7.350–7.450)

## 2013-01-23 LAB — PREPARE FRESH FROZEN PLASMA: Unit division: 0

## 2013-01-23 LAB — PREPARE RBC (CROSSMATCH)

## 2013-01-23 LAB — CBC
HCT: 24.1 % — ABNORMAL LOW (ref 36.0–46.0)
MCH: 29.2 pg (ref 26.0–34.0)
MCHC: 36.2 g/dL — ABNORMAL HIGH (ref 30.0–36.0)
MCV: 81.1 fL (ref 78.0–100.0)
MCV: 80.6 fL (ref 78.0–100.0)
Platelets: 64 10*3/uL — ABNORMAL LOW (ref 150–400)
Platelets: 64 10*3/uL — ABNORMAL LOW (ref 150–400)
RBC: 2.97 MIL/uL — ABNORMAL LOW (ref 3.87–5.11)
WBC: 17.7 10*3/uL — ABNORMAL HIGH (ref 4.0–10.5)

## 2013-01-23 LAB — BASIC METABOLIC PANEL
BUN: 17 mg/dL (ref 6–23)
CO2: 23 mEq/L (ref 19–32)
Chloride: 101 mEq/L (ref 96–112)
GFR calc non Af Amer: >90 mL/min (ref >90)
Glucose, Bld: 122 mg/dL — ABNORMAL HIGH (ref 70–99)
Potassium: 4.5 mEq/L (ref 3.5–5.1)

## 2013-01-23 LAB — DIC (DISSEMINATED INTRAVASCULAR COAGULATION)PANEL
D-Dimer, Quant: 1.11 ug/mL-FEU — ABNORMAL HIGH (ref 0.00–0.48)
INR: 1.19 (ref 0.00–1.49)
INR: 1.31 (ref 0.00–1.49)
Platelets: 67 10*3/uL — ABNORMAL LOW (ref 150–400)
Platelets: ADEQUATE 10*3/uL (ref 150–400)
Smear Review: NONE SEEN

## 2013-01-23 LAB — APTT
aPTT: 32 seconds (ref 24–37)
aPTT: 34 seconds (ref 24–37)

## 2013-01-23 LAB — PROCALCITONIN: Procalcitonin: 3.33 ng/mL

## 2013-01-23 LAB — CORTISOL: Cortisol, Plasma: 19.1 ug/dL

## 2013-01-23 LAB — LACTIC ACID, PLASMA: Lactic Acid, Venous: 2.6 mmol/L — ABNORMAL HIGH (ref 0.5–2.2)

## 2013-01-23 LAB — PROTIME-INR: Prothrombin Time: 16.2 seconds — ABNORMAL HIGH (ref 11.6–15.2)

## 2013-01-23 SURGERY — SALPINGO-OOPHORECTOMY, OPEN
Anesthesia: General

## 2013-01-23 SURGERY — LAPAROTOMY, EXPLORATORY
Anesthesia: General

## 2013-01-23 SURGERY — LAPAROTOMY, EXPLORATORY
Anesthesia: General | Site: Abdomen | Wound class: Clean Contaminated

## 2013-01-23 MED ORDER — SODIUM CHLORIDE 0.9 % IV BOLUS (SEPSIS)
1000.0000 mL | Freq: Once | INTRAVENOUS | Status: AC
  Administered 2013-01-23: 1000 mL via INTRAVENOUS

## 2013-01-23 MED ORDER — SUCCINYLCHOLINE CHLORIDE 20 MG/ML IJ SOLN: INTRAMUSCULAR | Status: DC | PRN

## 2013-01-23 MED ORDER — FENTANYL CITRATE 0.05 MG/ML IJ SOLN
INTRAMUSCULAR | Status: AC
  Filled 2013-01-23: qty 5

## 2013-01-23 MED ORDER — ACETAMINOPHEN 10 MG/ML IV SOLN: 1000.0000 mg | Freq: Once | INTRAVENOUS | Status: DC | PRN

## 2013-01-23 MED ORDER — PROMETHAZINE HCL 25 MG/ML IJ SOLN: 6.2500 mg | INTRAMUSCULAR | Status: DC | PRN

## 2013-01-23 MED ORDER — FENTANYL CITRATE 0.05 MG/ML IJ SOLN
50.0000 ug | INTRAMUSCULAR | Status: DC | PRN
  Administered 2013-01-23: 50 ug via INTRAVENOUS
  Administered 2013-01-23: 100 ug via INTRAVENOUS

## 2013-01-23 MED ORDER — SUCCINYLCHOLINE CHLORIDE 20 MG/ML IJ SOLN
INTRAMUSCULAR | Status: DC | PRN
  Administered 2013-01-23: 100 mg via INTRAVENOUS

## 2013-01-23 MED ORDER — ETOMIDATE 2 MG/ML IV SOLN
INTRAVENOUS | Status: DC | PRN
  Administered 2013-01-23: 20 mg via INTRAVENOUS

## 2013-01-23 MED ORDER — ETOMIDATE 2 MG/ML IV SOLN
INTRAVENOUS | Status: DC | PRN
  Administered 2013-01-23: 16 mg via INTRAVENOUS

## 2013-01-23 MED ORDER — ACETAMINOPHEN 650 MG RE SUPP
650.0000 mg | RECTAL | Status: DC | PRN
  Administered 2013-01-23 – 2013-01-27 (×3): 650 mg via RECTAL
  Filled 2013-01-23 (×4): qty 1

## 2013-01-23 MED ORDER — CHLORHEXIDINE GLUCONATE 0.12 % MT SOLN
15.0000 mL | Freq: Two times a day (BID) | OROMUCOSAL | Status: DC
  Administered 2013-01-23 – 2013-01-27 (×7): 15 mL via OROMUCOSAL
  Filled 2013-01-23 (×7): qty 15

## 2013-01-23 MED ORDER — PIPERACILLIN-TAZOBACTAM 3.375 G IVPB
3.3750 g | Freq: Three times a day (TID) | INTRAVENOUS | Status: DC
  Administered 2013-01-23 – 2013-01-27 (×13): 3.375 g via INTRAVENOUS
  Filled 2013-01-23 (×14): qty 50

## 2013-01-23 MED ORDER — FENTANYL CITRATE 0.05 MG/ML IJ SOLN
INTRAMUSCULAR | Status: DC | PRN
  Administered 2013-01-23 (×2): 50 ug via INTRAVENOUS

## 2013-01-23 MED ORDER — HYDROMORPHONE HCL PF 1 MG/ML IJ SOLN: 0.2500 mg | INTRAMUSCULAR | Status: DC | PRN

## 2013-01-23 MED ORDER — MEPERIDINE HCL 25 MG/ML IJ SOLN: 6.2500 mg | INTRAMUSCULAR | Status: DC | PRN

## 2013-01-23 MED ORDER — ROCURONIUM BROMIDE 100 MG/10ML IV SOLN
INTRAVENOUS | Status: DC | PRN
  Administered 2013-01-23 (×2): 20 mg via INTRAVENOUS

## 2013-01-23 MED ORDER — FENTANYL BOLUS VIA INFUSION
25.0000 ug | Freq: Four times a day (QID) | INTRAVENOUS | Status: DC | PRN
  Filled 2013-01-23: qty 100

## 2013-01-23 MED ORDER — MIDAZOLAM HCL 2 MG/2ML IJ SOLN
INTRAMUSCULAR | Status: AC
  Filled 2013-01-23: qty 2

## 2013-01-23 MED ORDER — PHENYLEPHRINE HCL 10 MG/ML IJ SOLN
10.0000 mg | INTRAVENOUS | Status: DC | PRN
  Administered 2013-01-23: 80 ug/min via INTRAVENOUS

## 2013-01-23 MED ORDER — PHENYLEPHRINE HCL 10 MG/ML IJ SOLN
INTRAMUSCULAR | Status: DC | PRN
  Administered 2013-01-23 (×6): 40 mg via INTRAVENOUS

## 2013-01-23 MED ORDER — FENTANYL CITRATE 0.05 MG/ML IJ SOLN
INTRAMUSCULAR | Status: AC
  Filled 2013-01-23: qty 2

## 2013-01-23 MED ORDER — SODIUM CHLORIDE 0.9 % IV SOLN
25.0000 ug/h | INTRAVENOUS | Status: DC
  Administered 2013-01-23: 150 ug/h via INTRAVENOUS
  Administered 2013-01-23: 100 ug/h via INTRAVENOUS
  Administered 2013-01-24 – 2013-01-26 (×3): 175 ug/h via INTRAVENOUS
  Administered 2013-01-26 – 2013-01-27 (×2): 200 ug/h via INTRAVENOUS
  Filled 2013-01-23 (×8): qty 50

## 2013-01-23 MED ORDER — BIOTENE DRY MOUTH MT LIQD
15.0000 mL | Freq: Four times a day (QID) | OROMUCOSAL | Status: DC
  Administered 2013-01-23 – 2013-01-31 (×22): 15 mL via OROMUCOSAL

## 2013-01-23 MED ORDER — SODIUM CHLORIDE 0.9 % IR SOLN
Status: DC | PRN
  Administered 2013-01-23: 1000 mL

## 2013-01-23 MED ORDER — MIDAZOLAM HCL 5 MG/5ML IJ SOLN
INTRAMUSCULAR | Status: DC | PRN
  Administered 2013-01-23 (×2): 1 mg via INTRAVENOUS

## 2013-01-23 MED ORDER — SODIUM CHLORIDE 0.9 % IV SOLN
INTRAVENOUS | Status: DC | PRN
  Administered 2013-01-23: 07:00:00 via INTRAVENOUS

## 2013-01-23 MED ORDER — FENTANYL CITRATE 0.05 MG/ML IJ SOLN
INTRAMUSCULAR | Status: DC | PRN
  Administered 2013-01-23 (×2): 25 ug via INTRAVENOUS
  Administered 2013-01-23: 50 ug via INTRAVENOUS
  Administered 2013-01-23 (×6): 25 ug via INTRAVENOUS
  Administered 2013-01-23: 100 ug via INTRAVENOUS

## 2013-01-23 MED ORDER — ARTIFICIAL TEARS OP OINT
TOPICAL_OINTMENT | OPHTHALMIC | Status: DC | PRN
  Administered 2013-01-23: 1 via OPHTHALMIC

## 2013-01-23 MED ORDER — MIDAZOLAM HCL 2 MG/2ML IJ SOLN
2.0000 mg | INTRAMUSCULAR | Status: DC | PRN
  Administered 2013-01-23: 2 mg via INTRAVENOUS
  Filled 2013-01-23: qty 2

## 2013-01-23 MED ORDER — ROCURONIUM BROMIDE 100 MG/10ML IV SOLN
INTRAVENOUS | Status: DC | PRN
  Administered 2013-01-23: 20 mg via INTRAVENOUS
  Administered 2013-01-23: 30 mg via INTRAVENOUS
  Administered 2013-01-23: 20 mg via INTRAVENOUS
  Administered 2013-01-23: 40 mg via INTRAVENOUS
  Administered 2013-01-23: 30 mg via INTRAVENOUS

## 2013-01-23 MED ORDER — MIDAZOLAM HCL 2 MG/2ML IJ SOLN
2.0000 mg | Freq: Once | INTRAMUSCULAR | Status: DC
  Filled 2013-01-23: qty 2

## 2013-01-23 MED ORDER — IOHEXOL 300 MG/ML  SOLN
150.0000 mL | Freq: Once | INTRAMUSCULAR | Status: AC | PRN
  Administered 2013-01-23: 1 mL via INTRAVENOUS

## 2013-01-23 MED ORDER — DEXAMETHASONE SODIUM PHOSPHATE 4 MG/ML IJ SOLN: INTRAMUSCULAR | Status: DC | PRN

## 2013-01-23 MED ORDER — LACTATED RINGERS IV SOLN
INTRAVENOUS | Status: DC | PRN
  Administered 2013-01-23: 11:00:00 via INTRAVENOUS

## 2013-01-23 MED ORDER — LACTATED RINGERS IV SOLN
INTRAVENOUS | Status: DC | PRN
  Administered 2013-01-23 (×2): via INTRAVENOUS

## 2013-01-23 SURGICAL SUPPLY — 45 items
BARRIER ADHS 3X4 INTERCEED (GAUZE/BANDAGES/DRESSINGS) IMPLANT
CANISTER SUCTION 2500CC (MISCELLANEOUS) ×4 IMPLANT
CELLS DAT CNTRL 66122 CELL SVR (MISCELLANEOUS) IMPLANT
CHLORAPREP W/TINT 26ML (MISCELLANEOUS) IMPLANT
CLOTH BEACON ORANGE TIMEOUT ST (SAFETY) ×2 IMPLANT
CONT PATH 16OZ SNAP LID 3702 (MISCELLANEOUS) IMPLANT
DECANTER SPIKE VIAL GLASS SM (MISCELLANEOUS) IMPLANT
DRAIN JACKSON PRT FLT 10 (DRAIN) ×2 IMPLANT
DRSG COVADERM 4X10 (GAUZE/BANDAGES/DRESSINGS) ×2 IMPLANT
EVACUATOR 3/16  PVC DRAIN (DRAIN) ×1
EVACUATOR 3/16 PVC DRAIN (DRAIN) ×1 IMPLANT
GAUZE SPONGE 4X4 16PLY XRAY LF (GAUZE/BANDAGES/DRESSINGS) ×2 IMPLANT
GLOVE BIO SURGEON STRL SZ7 (GLOVE) ×2 IMPLANT
GLOVE BIOGEL PI IND STRL 7.0 (GLOVE) ×3 IMPLANT
GLOVE BIOGEL PI INDICATOR 7.0 (GLOVE) ×3
GOWN PREVENTION PLUS LG XLONG (DISPOSABLE) ×6 IMPLANT
NEEDLE HYPO 25X1 1.5 SAFETY (NEEDLE) IMPLANT
NS IRRIG 1000ML POUR BTL (IV SOLUTION) ×2 IMPLANT
PACK ABDOMINAL GYN (CUSTOM PROCEDURE TRAY) ×2 IMPLANT
PAD ABD 7.5X8 STRL (GAUZE/BANDAGES/DRESSINGS) ×2 IMPLANT
PAD OB MATERNITY 4.3X12.25 (PERSONAL CARE ITEMS) IMPLANT
RETRACTOR WND ALEXIS 25 LRG (MISCELLANEOUS) IMPLANT
RTRCTR WOUND ALEXIS 18CM MED (MISCELLANEOUS)
RTRCTR WOUND ALEXIS 25CM LRG (MISCELLANEOUS)
SPONGE GAUZE 4X4 12PLY (GAUZE/BANDAGES/DRESSINGS) ×2 IMPLANT
SPONGE LAP 18X18 X RAY DECT (DISPOSABLE) ×6 IMPLANT
STAPLER VISISTAT 35W (STAPLE) ×2 IMPLANT
SUT PDS AB 0 CT 36 (SUTURE) ×2 IMPLANT
SUT PDS AB 0 CT1 27 (SUTURE) IMPLANT
SUT PDS AB 0 CTX 60 (SUTURE) ×4 IMPLANT
SUT PLAIN 2 0 XLH (SUTURE) IMPLANT
SUT SILK 2 0 FSL 18 (SUTURE) ×2 IMPLANT
SUT VIC AB 0 CT1 27 (SUTURE)
SUT VIC AB 0 CT1 27XBRD ANBCTR (SUTURE) IMPLANT
SUT VIC AB 0 CT1 36 (SUTURE) ×4 IMPLANT
SUT VIC AB 2-0 SH 27 (SUTURE)
SUT VIC AB 2-0 SH 27XBRD (SUTURE) IMPLANT
SUT VIC AB 3-0 SH 27 (SUTURE)
SUT VIC AB 3-0 SH 27X BRD (SUTURE) IMPLANT
SUT VICRYL 0 TIES 12 18 (SUTURE) ×2 IMPLANT
SYR CONTROL 10ML LL (SYRINGE) IMPLANT
TAPE CLOTH SURG 4X10 WHT LF (GAUZE/BANDAGES/DRESSINGS) ×2 IMPLANT
TOWEL OR 17X24 6PK STRL BLUE (TOWEL DISPOSABLE) ×4 IMPLANT
TRAY FOLEY CATH 14FR (SET/KITS/TRAYS/PACK) IMPLANT
WATER STERILE IRR 1000ML POUR (IV SOLUTION) ×2 IMPLANT

## 2013-01-23 SURGICAL SUPPLY — 41 items
BLADE SURG ROTATE 9660 (MISCELLANEOUS) IMPLANT
CANISTER SUCTION 2500CC (MISCELLANEOUS) ×2 IMPLANT
CHLORAPREP W/TINT 26ML (MISCELLANEOUS) ×2 IMPLANT
CLOTH BEACON ORANGE TIMEOUT ST (SAFETY) ×2 IMPLANT
COVER SURGICAL LIGHT HANDLE (MISCELLANEOUS) ×2 IMPLANT
DRAPE LAPAROSCOPIC ABDOMINAL (DRAPES) ×2 IMPLANT
DRAPE UTILITY 15X26 W/TAPE STR (DRAPE) ×4 IMPLANT
DRAPE WARM FLUID 44X44 (DRAPE) ×2 IMPLANT
ELECT BLADE 6.5 EXT (BLADE) IMPLANT
ELECT CAUTERY BLADE 6.4 (BLADE) ×2 IMPLANT
ELECT REM PT RETURN 9FT ADLT (ELECTROSURGICAL) ×2
ELECTRODE REM PT RTRN 9FT ADLT (ELECTROSURGICAL) ×1 IMPLANT
GLOVE BIO SURGEON STRL SZ7 (GLOVE) ×2 IMPLANT
GLOVE BIOGEL PI IND STRL 7.5 (GLOVE) ×1 IMPLANT
GLOVE BIOGEL PI INDICATOR 7.5 (GLOVE) ×1
GOWN STRL NON-REIN LRG LVL3 (GOWN DISPOSABLE) ×6 IMPLANT
KIT BASIN OR (CUSTOM PROCEDURE TRAY) ×2 IMPLANT
KIT ROOM TURNOVER OR (KITS) ×2 IMPLANT
LIGASURE IMPACT 36 18CM CVD LR (INSTRUMENTS) IMPLANT
NS IRRIG 1000ML POUR BTL (IV SOLUTION) ×4 IMPLANT
PACK GENERAL/GYN (CUSTOM PROCEDURE TRAY) ×2 IMPLANT
PAD ARMBOARD 7.5X6 YLW CONV (MISCELLANEOUS) ×2 IMPLANT
PAD NEG PRESSURE SENSATRAC (MISCELLANEOUS) ×2 IMPLANT
SPECIMEN JAR LARGE (MISCELLANEOUS) IMPLANT
SPONGE ABDOMINAL VAC ABTHERA (MISCELLANEOUS) ×2 IMPLANT
SPONGE LAP 18X18 X RAY DECT (DISPOSABLE) IMPLANT
STAPLER VISISTAT 35W (STAPLE) ×2 IMPLANT
SUCTION POOLE TIP (SUCTIONS) ×2 IMPLANT
SUT PDS AB 1 TP1 96 (SUTURE) ×4 IMPLANT
SUT SILK 2 0 (SUTURE) ×1
SUT SILK 2 0 SH CR/8 (SUTURE) ×2 IMPLANT
SUT SILK 2-0 18XBRD TIE 12 (SUTURE) ×1 IMPLANT
SUT SILK 3 0 (SUTURE) ×1
SUT SILK 3 0 SH CR/8 (SUTURE) ×2 IMPLANT
SUT SILK 3-0 18XBRD TIE 12 (SUTURE) ×1 IMPLANT
SUT VIC AB 3-0 SH 27 (SUTURE)
SUT VIC AB 3-0 SH 27X BRD (SUTURE) IMPLANT
TOWEL OR 17X26 10 PK STRL BLUE (TOWEL DISPOSABLE) ×2 IMPLANT
TRAY FOLEY CATH 14FRSI W/METER (CATHETERS) IMPLANT
WATER STERILE IRR 1000ML POUR (IV SOLUTION) IMPLANT
YANKAUER SUCT BULB TIP NO VENT (SUCTIONS) IMPLANT

## 2013-01-23 NOTE — Op Note (Addendum)
Aiesha Lawh Magan PROCEDURE DATE:  01/23/2013  PREOPERATIVE DIAGNOSES: Concern about postoperative bleeding on POD#1 s/p cesarean hysterectomy complicated by massive hemorrhage, consumptive coagulopathy and hemorrhagic shock  POSTOPERATIVE DIAGNOSIS: Large left-sided retroperitoneal hematoma  PROCEDURE: Reoperation with exploratory laparotomy, evacuation of hemoperitoneum, left fimbriectomy, identification of large left-sided retroperitoneal hematoma  SURGEON:  Dr. Jaynie Collins  ASSISTANT:  Dr. Catalina Antigua  ANESTHESIOLOGIST: Dr. Lewie Loron  ANESTHESIA: General  INDICATIONS: Alessa Zi Sek is a 27 y.o. [id number] s/p cesarean hysterectomy on 01/22/13 complicated by massive hemorrhage and consumptive coagulopathy needing several units of blood products with activation of the massive transfusion protocol.  Please see previous operative report and subsequent progress notes for further details.   Using a phone [place]ese interpreter, the risks of reoperation were discussed with the patient including but were not limited to: bleeding which may require further transfusion or another reoperation; infection which may require antibiotics; injury to bowel, bladder, ureters or other surrounding organs; need for additional procedures; incisional problems, thromboembolic phenomenon and other postoperative/anesthesia complications.   The patient concurred with the proposed plan, giving informed written consent for the procedure.    FINDINGS:  Moderate (~600 ml) of hemoperitoneum noted with serosanguinous drainage, no active bleeding noted.  All pedicles were examined and were noted to be hemostatic.  There was small amount of bleeding noted on the left fimbriae; this was clamped, cut and suture ligated.  There was a large retroperitoneal hematoma noted extending from the level of the left adnexa to the spleen, displacing the adnexa and bowel to the right.   INTRAOPERATIVE PHONE CONSULTS: Dr. Maisie Fus  Cornett (General Surgery) and Dr. Cyril Mourning (Critical Care Medicine)  INTRAVENOUS FLUIDS/BLOOD PRODUCTS: 100 ml of NS, one pack of pRBCs, one pack of FFP, one pack of platelets ESTIMATED BLOOD LOSS: 600 ml URINE OUTPUT:  200 ml DRAINS: Hemovac drain placed in the pelvis and brought out on the right side to be attached to closed suction unit; foley catheter in bladder attached to a urine collection bag under constant gravity; nasogastric tube SPECIMENS: Left fimbriae sent to pathology COMPLICATIONS: No immediate intraoperative complications noted  PROCEDURE IN DETAIL:  The patient was then taken to the operating room where general anesthesia was administered and was found to be adequate.  She was then placed in a dorsal supine position, and the incisional staples were removed and the JP drain was also removed.  She was then prepped and draped in a sterile manner.  A foley catheter was already placed into her bladder and attached to to a urine collection bag under constant gravity.  After an adequate timeout was performed, her fascial incisional sutures were cut and the interrupted sutures on the muscles were also cut allowing access to the peritoneal cavity.  Sanguinous drainage was noted, about 600 ml was suctioned off after placing the patient in reverse trendelenburg position.  The pedicles from the cesarean hysterectomy were examined and found to be hemostatic.   There was small amount of bleeding noted on the left fimbriae; this was clamped, cut and suture ligated.  On further abdominal examination, there was a large left retroperitoneal hematoma noted extending from the level of the left adnexa to the spleen, displacing the adnexa and bowel to the right.   Of note, the left adnexa was left in place during her previous surgery and no incision was made in this area; the left infundibulopelvic ligament was not clamped or ligated. There was no exploration or dissection of the retroperitoneal  place during  the surgery.  As a result, there did not seem to be any apparent obstetric or gynecologic surgical etiology for this large retroperitoneal hematoma.   At this point, a General Surgery consult was done over the phone; Dr. Luisa Hart was informed of the operative findings.  He reported that there are cases of spontaneous retroperitoneal hematomas in the setting of coagulopathy; there is no need for surgical exploration of the hematoma as this is very dangerous and can be fatal.  He recommended closing and sending the patient to the [facility] ICU to be under the care of the Critical Care Medicine team as she will need aggressive resuscitation and support with blood products and may need an angiogram.  Dr. Cyril Mourning from Critical Care Medicine (CCM) was then called and updated about the operative findings; CCM has been following the patient postoperatively after her complicated cesarean hysterectomy.  He agreed with the plan as proposed by Dr. Luisa Hart, and wanted the patient to remain intubated for the transfer for the [facility] operating room to [facility] ICU.     A Hemovac drain was then placed in the pelvis and brought out through the right sided incision that was previously used for the JP drain.  This drain was secured with a silk suture and connected to a closed suction container.  The fascia was then closed using 0 PDS in a running fashion.  The skin was closed with staples. The patient tolerated the procedure well. Sponge, lap, instrument and needle counts were correct x 2.  She remained intubated and then transferred to the [facility] ICU by the [facility] team.   We will continue to follow along and appreciate CCM's help in taking care of this very complex patient situation.  The patient's family was informed of the operative findings and of the need for transfer to [facility] with the help of a phone [place] Bur[place]ese interpreter. Her newborn infant will remain in the [facility] nursery.

## 2013-01-23 NOTE — Progress Notes (Signed)
Patient ID: Mackenzie [name], female   DOB: 05/22/1986, 27 y.o.   MRN: [id number] Patient hct still low, pelvic arteriogram with bleeding brach on hypogastric deep in pelvis now coiled without bleeding, discussed with family, will bring back to icu and continue resuscitation.  This was not dic but surgical bleeding as source.  I think we have control now with packing and definitive control with angio.  Will plan on returning to or Monday am for repeat laparotomy unless there is change prior. Appreciate ccm, ir and anesthesia assistance

## 2013-01-23 NOTE — Anesthesia Preprocedure Evaluation (Signed)
Anesthesia Evaluation  Patient identified by MRN, date of birth, ID band Patient awake    Reviewed: Allergy & Precautions, H&P , Patient's Chart, lab work & pertinent test results  Airway Mallampati: II TM Distance: >3 FB Neck ROM: full    Dental no notable dental hx.    Pulmonary neg pulmonary ROS,  breath sounds clear to auscultation  Pulmonary exam normal       Cardiovascular negative cardio ROS  Rhythm:regular Rate:Normal     Neuro/Psych negative neurological ROS  negative psych ROS   GI/Hepatic negative GI ROS, Neg liver ROS,   Endo/Other  negative endocrine ROS  Renal/GU negative Renal ROS     Musculoskeletal   Abdominal   Peds  Hematology negative hematology ROS (+)   Anesthesia Other Findings Varicose veins     Hx of macrosomia in infant in prior pregnancy, currently pregnant   Abnormal fetal heart rhythm  Reproductive/Obstetrics (+) Pregnancy                           Anesthesia Physical  Anesthesia Plan  ASA: II  Anesthesia Plan: General   Post-op Pain Management:    Induction: Intravenous  Airway Management Planned: Oral ETT  Additional Equipment:   Intra-op Plan:   Post-operative Plan: Extubation in OR and Possible Post-op intubation/ventilation  Informed Consent: I have reviewed the patients History and Physical, chart, labs and discussed the procedure including the risks, benefits and alternatives for the proposed anesthesia with the patient or authorized representative who has indicated his/her understanding and acceptance.   Dental advisory given  Plan Discussed with: CRNA  Anesthesia Plan Comments:         Anesthesia Quick Evaluation

## 2013-01-23 NOTE — Anesthesia Preprocedure Evaluation (Signed)
Anesthesia Evaluation  Patient identified by MRN, date of birth, ID band Patient unresponsive    Reviewed: Allergy & Precautions, H&P , NPO status , Patient's Chart, lab work & pertinent test results  Airway       Dental   Pulmonary          Cardiovascular     Neuro/Psych    GI/Hepatic   Endo/Other    Renal/GU      Musculoskeletal   Abdominal   Peds  Hematology   Anesthesia Other Findings Emergent case from OR to interventional radiology  Reproductive/Obstetrics                           Anesthesia Physical Anesthesia Plan Anesthesia Quick Evaluation

## 2013-01-23 NOTE — Progress Notes (Signed)
Subjective: Postpartum Day 1: Cesarean Delivery with hysterectomy Patient reports pain on left flank. She denies incisional pain. She denies chest pain, SOB, lightheadedness/dizziness, nausea or emesis  Objective: Vital signs in last 24 hours: Temp:  [98.4 F (36.9 C)-102 F (38.9 C)] 99.1 F (37.3 C) (05/24 0230) Pulse Rate:  [42-148] 123 (05/24 0615) Resp:  [0-34] 28 (05/24 0600) BP: (52-123)/(29-73) 76/42 mmHg (05/23 1715) SpO2:  [80 %-100 %] 99 % (05/24 0615) Arterial Line BP: (65-139)/(29-76) 89/58 mmHg (05/24 0615) Weight:  [163 lb 14.4 oz (74.345 kg)] 163 lb 14.4 oz (74.345 kg) (05/23 1853)  Physical Exam:  General: alert, cooperative and no distress Abdomen: Softly distended with palpable fullness over left flank, tenderness to palpation over left flank Uterine Fundus: surgically removed Dressing: moderately stained DVT Evaluation: No cords or calf tenderness. SCD in place   Recent Labs  01/23/13 0315 01/23/13 0515  HGB 8.8* 8.4*  HCT 24.1* 23.2*    Assessment/Plan: Status post Cesarean section with hysterectomy - Although Hg is stable, her JP output has increased tremendously (whithin minutes of emptying it fills back up again)  - Case reviewed with Dr. Macon Large who agrees that patient needs an ex-lap to rule out active hemorrhage. - Massive transfusion protocol has been activated - Patient consented for ex-lap with the help of [place] interpreter [phone]  Mackenzie [name] 01/23/2013, 6:39 AM

## 2013-01-23 NOTE — Procedures (Signed)
Post left internal iliac arteriogram with sub selective coil embolization of a bleeding distal branch of the left internal iliac artery.  No immediate complications.  Keep right leg straight for 4 hrs.

## 2013-01-23 NOTE — Progress Notes (Signed)
Utilization review completed.  P.J. Lando Alcalde,RN,BSN Case Manager [phone]  

## 2013-01-23 NOTE — Progress Notes (Signed)
Pt's JP drainage increased sinificantly, Dr. Jolayne Panther has been at bedside evaluating pt. And has proceeded to get consent for OR via telephonic interpreter and MTP activated by [facility] Janalyn Shy, RN

## 2013-01-23 NOTE — Preoperative (Signed)
Beta Blockers   Reason not to administer Beta Blockers:Not Applicable 

## 2013-01-23 NOTE — Progress Notes (Signed)
PCCM  Pt seen post OR and post IR procedures.  Pt has open wound vac and packing and has IR procedure done with successful embolization of L internal iliac artery per Dr Grace Isaac.  [facility] YOU!!!)  Pt now on vent, BP and HR better. Off all vasopressors. Pt finishing blood products.  Pt getting f/u labs. Family updated in full with interpreter.   IMP:  Massive hemorrhagic shock  S/p hysterectomy  Postop surgical bleeding S/p IR procedure.   Plan F/u lab Cont full vent supp See orders  CC Dorcas Carrow Beeper  [phone]  Cell  [phone]  If no response or cell goes to voicemail, call beeper [phone]

## 2013-01-23 NOTE — Op Note (Signed)
Preoperative diagnosis: Hemorrhagic shock Postoperative diagnosis: Same as above, left retroperitoneal hematoma Procedure: Exploratory laparotomy, intra-abdominal packing, vac placement Surgeon: Dr. Harden Mo Asst.: Dr. Marca Ancona Anesthesia: Gen. Estimated blood loss: 500 cc Drains: VAC placed Complications: None Sponge needle count was correct, abdomen was packed Disposition to interventional radiology in critical condition Specimens: None  Indications: This is a 27 year old female who was recently transferred for [facility] after sustaining a uterine rupture at the time of cesarean section resulting in hysterectomy. She was then re-explored for hemorrhage. She had 600 cc of intraperitoneal hemorrhage as well as what was described as a left-sided retroperitoneal hematoma at the time of her surgery. She was then transferred to colon. Upon arrival here she was clearly in hemorrhagic shock with an intraperitoneal Hemovac drain filling with blood. I discussed with her family through an interpreter proceeding to the operating room emergently.  Procedure: After informed consent was obtained from the patient's family she was taken to the operating room. Sequential compression devices were on her legs. She was placed under general anesthesia. Her abdomen was prepped and draped in the standard sterile surgical fashion. A surgical timeout was performed.  I made a midline incision. I entered into her abdomen there was about 500 cc of clot as well as intraperitoneal blood. Her abdomen was packed in all 4 quadrants. At this point in time she was hemodynamically normal receiving blood products due to massive transfusion protocol as well as Neo-Synephrine. We then unpacked these areas. She has a very large left sided retroperitoneal hematoma that is coming from her surgical site. I think this is decompressed a little bit superiorly which accounts for her intraperitoneal blood. I packed all of this.  She remained stable following packing. I then decided to place an abdominal VAC. I discussed her case with interventional radiology we're going to proceed to interventional radiology for angiography and possible hypogastric embolization on the left side. I discussed with her family if this is not successful and she continues to bleed that she may need reexploration. I also thought that she had abdominal compartment syndrome at the time of her surgery.

## 2013-01-23 NOTE — Transfer of Care (Signed)
Immediate Anesthesia Transfer of Care Note  Patient: Mackenzie [name]  Procedure(s) Performed: Procedure(s): EXPLORATORY LAPAROTOMY (N/A)  Patient Location: SICU  Anesthesia Type:General  Level of Consciousness: sedated and unresponsive  Airway & Oxygen Therapy: Patient remains intubated per anesthesia plan and Patient placed on Ventilator (see vital sign flow sheet for setting)  Post-op Assessment: Report given to PACU RN and Post -op Vital signs reviewed and stable  Post vital signs: Reviewed and stable  Complications: No apparent anesthesia complications

## 2013-01-23 NOTE — Progress Notes (Signed)
Fever; receiving transfusion; hold on the last unit of FFP. Obtain coags;    Acetaminophen for fever,

## 2013-01-23 NOTE — Consult Note (Signed)
Reason for Consult:bleeding Referring Physician: Dr Danise Mina  Mackenzie [name] is an 27 y.o. female.  HPI: 9 yof s/p c/s then uterine rupture with bleeding.  Has left sided rp hematoma per report.  Arrives at [facility] in hemorrhagic shock, not really in dic, blood filling hemovac.  Past Medical History  Diagnosis Date  . Varicose veins   . Hx of macrosomia in infant in prior pregnancy, currently pregnant     Past Surgical History  Procedure Laterality Date  . Cesarean section      Family History  Problem Relation Age of Onset  . Alcohol abuse Neg Hx   . Arthritis Neg Hx   . Asthma Neg Hx   . Birth defects Neg Hx   . Cancer Neg Hx   . COPD Neg Hx   . Depression Neg Hx   . Diabetes Neg Hx   . Drug abuse Neg Hx   . Early death Neg Hx   . Hearing loss Neg Hx   . Heart disease Neg Hx   . Kidney disease Neg Hx   . Hypertension Neg Hx   . Hyperlipidemia Neg Hx   . Learning disabilities Neg Hx   . Mental illness Neg Hx   . Mental retardation Neg Hx   . Miscarriages / Stillbirths Neg Hx   . Stroke Neg Hx   . Vision loss Neg Hx     Social History:  reports that she has never smoked. She has never used smokeless tobacco. She reports that she does not drink alcohol or use illicit drugs.  Allergies: No Known Allergies  Medications: I have reviewed the patient's current medications.  Results for orders placed during the hospital encounter of 01/21/13 (from the past 48 hour(s))  MASSIVE TRANSFUSION PROTOCOL ORDER (BLOOD BANK NOTIFICATION)     Status: None   Collection Time    01/22/13  8:55 AM      Result Value Range   Initiate Massive Transfusion Protocol       Value: MTP ACTIVATED VERBAL MESSAGE DELIVERED PER OR RUNNER DAVID  PREPARE RBC (CROSSMATCH)     Status: None   Collection Time    01/22/13  9:00 AM      Result Value Range   Order Confirmation ORDER PROCESSED BY BLOOD BANK    PREPARE FRESH FROZEN PLASMA     Status: None   Collection Time    01/22/13  9:33 AM   Result Value Range   Unit Number [id number]     Blood Component Type FP24, THAWED     Unit division 00     Status of Unit ISSUED     Transfusion Status OK TO TRANSFUSE     Unit Number [id number]     Blood Component Type FP24, THAWED     Unit division 00     Status of Unit ISSUED     Transfusion Status OK TO TRANSFUSE     Unit Number [id number]     Blood Component Type FP24, THAWED     Unit division 00     Status of Unit ISSUED     Transfusion Status OK TO TRANSFUSE     Unit Number [id number]     Blood Component Type FP24, THAWED     Unit division 00     Status of Unit ISSUED     Transfusion Status OK TO TRANSFUSE     Unit Number [id number]     Blood Component Type FP24, THAWED  Unit division 00     Status of Unit ISSUED     Transfusion Status OK TO TRANSFUSE     Unit Number [id number]     Blood Component Type FP24, THAWED     Unit division 00     Status of Unit ISSUED     Transfusion Status OK TO TRANSFUSE    PREPARE PLATELET PHERESIS     Status: None   Collection Time    01/22/13  9:33 AM      Result Value Range   Unit Number [id number]     Blood Component Type PLTPHER LR2     Unit division 00     Status of Unit ISSUED     Transfusion Status OK TO TRANSFUSE     Unit Number [id number]     Blood Component Type PLTPHER LR2     Unit division 00     Status of Unit ISSUED     Transfusion Status OK TO TRANSFUSE    BASIC METABOLIC PANEL     Status: Abnormal   Collection Time    01/22/13 12:10 PM      Result Value Range   Sodium 135  135 - 145 mEq/L   Potassium 3.9  3.5 - 5.1 mEq/L   Chloride 103  96 - 112 mEq/L   CO2 24  19 - 32 mEq/L   Glucose, Bld 104 (*) 70 - 99 mg/dL   BUN 8  6 - 23 mg/dL   Creatinine, Ser 3.66  0.50 - 1.10 mg/dL   Calcium 7.8 (*) 8.4 - 10.5 mg/dL   GFR calc non Af Amer >90  >90 mL/min   GFR calc Af Amer >90  >90 mL/min   Comment:            The eGFR has been calculated     using the CKD EPI equation.     This  calculation has not been     validated in all clinical     situations.     eGFR's persistently     <90 mL/min signify     possible Chronic Kidney Disease.  DIC (DISSEMINATED INTRAVASCULAR COAGULATION) PANEL     Status: Abnormal   Collection Time    01/22/13 12:10 PM      Result Value Range   Prothrombin Time 15.0  11.6 - 15.2 seconds   INR 1.20  0.00 - 1.49   aPTT 27  24 - 37 seconds   Fibrinogen 258  204 - 475 mg/dL   D-Dimer, Quant 4.40 (*) 0.00 - 0.48 ug/mL-FEU   Comment:            AT THE INHOUSE ESTABLISHED CUTOFF     VALUE OF 0.48 ug/mL FEU,     THIS ASSAY HAS BEEN DOCUMENTED     IN THE LITERATURE TO HAVE     A SENSITIVITY AND NEGATIVE     PREDICTIVE VALUE OF AT LEAST     98 TO 99%.  THE TEST RESULT     SHOULD BE CORRELATED WITH     AN ASSESSMENT OF THE CLINICAL     PROBABILITY OF DVT / VTE.   Platelets 114 (*) 150 - 400 K/uL   Comment: REPEATED TO VERIFY     SPECIMEN CHECKED FOR CLOTS     PLATELET COUNT CONFIRMED BY SMEAR   Smear Review NO SCHISTOCYTES SEEN    CBC     Status: Abnormal   Collection Time    01/22/13 12:40  PM      Result Value Range   WBC 19.6 (*) 4.0 - 10.5 K/uL   Comment: WHITE COUNT CONFIRMED ON SMEAR   RBC 3.03 (*) 3.87 - 5.11 MIL/uL   Hemoglobin 9.1 (*) 12.0 - 15.0 g/dL   Comment: DELTA CHECK NOTED     REPEATED TO VERIFY     AFTER SURGERY   HCT 26.4 (*) 36.0 - 46.0 %   MCV 87.1  78.0 - 100.0 fL   MCH 30.0  26.0 - 34.0 pg   MCHC 34.5  30.0 - 36.0 g/dL   RDW 40.9  81.1 - 91.4 %   Platelets 114 (*) 150 - 400 K/uL   Comment: PLATELET COUNT CONFIRMED BY SMEAR     SPECIMEN CHECKED FOR CLOTS     REPEATED TO VERIFY  CBC     Status: Abnormal   Collection Time    01/22/13  1:49 PM      Result Value Range   WBC 14.4 (*) 4.0 - 10.5 K/uL   RBC 2.92 (*) 3.87 - 5.11 MIL/uL   Hemoglobin 8.0 (*) 12.0 - 15.0 g/dL   HCT 78.2 (*) 95.6 - 21.3 %   MCV 81.2  78.0 - 100.0 fL   Comment: DELTA CHECK NOTED     REPEATED TO VERIFY     POST TRANSFUSION  SPECIMEN   MCH 27.4  26.0 - 34.0 pg   MCHC 33.8  30.0 - 36.0 g/dL   RDW 08.6 (*) 57.8 - 46.9 %   Platelets 67 (*) 150 - 400 K/uL   Comment: DELTA CHECK NOTED     REPEATED TO VERIFY     SPECIMEN CHECKED FOR CLOTS     PLATELET COUNT CONFIRMED BY SMEAR     RESULT CALLED TO, READ BACK BY AND VERIFIED WITH:     MONTTELLIER,C AT 1415 ON 01/22/13 BY HAGGINSC  PREPARE RBC (CROSSMATCH)     Status: None   Collection Time    01/22/13  2:29 PM      Result Value Range   Order Confirmation ORDER PROCESSED BY BLOOD BANK    PREPARE PLATELET PHERESIS     Status: None   Collection Time    01/22/13  2:32 PM      Result Value Range   Unit Number [id number]     Blood Component Type PLTPHER LR2     Unit division 00     Status of Unit ISSUED     Transfusion Status OK TO TRANSFUSE    CBC     Status: Abnormal   Collection Time    01/22/13  5:45 PM      Result Value Range   WBC 17.7 (*) 4.0 - 10.5 K/uL   RBC 2.62 (*) 3.87 - 5.11 MIL/uL   Hemoglobin 7.4 (*) 12.0 - 15.0 g/dL   HCT 10.2 (*) 72.5 - 36.6 %   MCV 79.4  78.0 - 100.0 fL   MCH 28.2  26.0 - 34.0 pg   MCHC 35.6  30.0 - 36.0 g/dL   RDW 44.0 (*) 34.7 - 42.5 %   Platelets 124 (*) 150 - 400 K/uL   Comment: DELTA CHECK NOTED     REPEATED TO VERIFY     POST TRANSFUSION SPECIMEN  DIC (DISSEMINATED INTRAVASCULAR COAGULATION) PANEL     Status: Abnormal   Collection Time    01/22/13  5:45 PM      Result Value Range   Prothrombin Time 17.5 (*) 11.6 - 15.2  seconds   INR 1.48  0.00 - 1.49   aPTT 92 (*) 24 - 37 seconds   Comment:            IF BASELINE aPTT IS ELEVATED,     SUGGEST PATIENT RISK ASSESSMENT     BE USED TO DETERMINE APPROPRIATE     ANTICOAGULANT THERAPY.   Fibrinogen 241  204 - 475 mg/dL   D-Dimer, Quant 1.19 (*) 0.00 - 0.48 ug/mL-FEU   Comment:            AT THE INHOUSE ESTABLISHED CUTOFF     VALUE OF 0.48 ug/mL FEU,     THIS ASSAY HAS BEEN DOCUMENTED     IN THE LITERATURE TO HAVE     A SENSITIVITY AND NEGATIVE      PREDICTIVE VALUE OF AT LEAST     98 TO 99%.  THE TEST RESULT     SHOULD BE CORRELATED WITH     AN ASSESSMENT OF THE CLINICAL     PROBABILITY OF DVT / VTE.   Platelets 124 (*) 150 - 400 K/uL   Comment: DELTA CHECK NOTED     REPEATED TO VERIFY     POST TRANSFUSION SPECIMEN   Smear Review NO SCHISTOCYTES SEEN    COMPREHENSIVE METABOLIC PANEL     Status: Abnormal   Collection Time    01/22/13  5:45 PM      Result Value Range   Sodium 133 (*) 135 - 145 mEq/L   Potassium 3.9  3.5 - 5.1 mEq/L   Chloride 102  96 - 112 mEq/L   CO2 23  19 - 32 mEq/L   Glucose, Bld 148 (*) 70 - 99 mg/dL   BUN 12  6 - 23 mg/dL   Creatinine, Ser 1.47  0.50 - 1.10 mg/dL   Calcium 7.4 (*) 8.4 - 10.5 mg/dL   Total Protein 3.4 (*) 6.0 - 8.3 g/dL   Albumin 1.6 (*) 3.5 - 5.2 g/dL   AST 18  0 - 37 U/L   ALT 9  0 - 35 U/L   Alkaline Phosphatase 43  39 - 117 U/L   Total Bilirubin 3.1 (*) 0.3 - 1.2 mg/dL   GFR calc non Af Amer >90  >90 mL/min   GFR calc Af Amer >90  >90 mL/min   Comment:            The eGFR has been calculated     using the CKD EPI equation.     This calculation has not been     validated in all clinical     situations.     eGFR's persistently     <90 mL/min signify     possible Chronic Kidney Disease.  LACTIC ACID, PLASMA     Status: Abnormal   Collection Time    01/22/13  5:45 PM      Result Value Range   Lactic Acid, Venous 2.9 (*) 0.5 - 2.2 mmol/L  PHOSPHORUS     Status: Abnormal   Collection Time    01/22/13  5:45 PM      Result Value Range   Phosphorus 5.0 (*) 2.3 - 4.6 mg/dL  MAGNESIUM     Status: Abnormal   Collection Time    01/22/13  5:45 PM      Result Value Range   Magnesium 1.1 (*) 1.5 - 2.5 mg/dL  CORTISOL     Status: None   Collection Time    01/22/13  5:45  PM      Result Value Range   Cortisol, Plasma 19.1     Comment: (NOTE)     AM:  4.3 - 22.4 ug/dL     PM:  3.1 - 46.9 ug/dL  BLOOD GAS, ARTERIAL     Status: Abnormal   Collection Time    01/22/13  6:54 PM       Result Value Range   FIO2 0.21     Delivery systems ROOM AIR     pH, Arterial 7.469 (*) 7.350 - 7.450   pCO2 arterial 30.7 (*) 35.0 - 45.0 mmHg   pO2, Arterial 112.0 (*) 80.0 - 100.0 mmHg   Bicarbonate 22.0  20.0 - 24.0 mEq/L   TCO2 23.0  0 - 100 mmol/L   Acid-base deficit 1.0  0.0 - 2.0 mmol/L   O2 Saturation 99.0     Collection site PERIPHERAL ARTERIAL LINE     Drawn by 329     Sample type ARTERIAL    GLUCOSE, CAPILLARY     Status: Abnormal   Collection Time    01/22/13  7:51 PM      Result Value Range   Glucose-Capillary 124 (*) 70 - 99 mg/dL  FIBRINOGEN     Status: None   Collection Time    01/22/13  8:20 PM      Result Value Range   Fibrinogen 280  204 - 475 mg/dL  BASIC METABOLIC PANEL     Status: Abnormal   Collection Time    01/22/13  8:20 PM      Result Value Range   Sodium 131 (*) 135 - 145 mEq/L   Potassium 3.9  3.5 - 5.1 mEq/L   Chloride 101  96 - 112 mEq/L   CO2 23  19 - 32 mEq/L   Glucose, Bld 160 (*) 70 - 99 mg/dL   BUN 14  6 - 23 mg/dL   Creatinine, Ser 6.29  0.50 - 1.10 mg/dL   Calcium 7.2 (*) 8.4 - 10.5 mg/dL   GFR calc non Af Amer >90  >90 mL/min   GFR calc Af Amer >90  >90 mL/min   Comment:            The eGFR has been calculated     using the CKD EPI equation.     This calculation has not been     validated in all clinical     situations.     eGFR's persistently     <90 mL/min signify     possible Chronic Kidney Disease.  CBC     Status: Abnormal   Collection Time    01/22/13  8:20 PM      Result Value Range   WBC 18.4 (*) 4.0 - 10.5 K/uL   RBC 2.40 (*) 3.87 - 5.11 MIL/uL   Hemoglobin 6.7 (*) 12.0 - 15.0 g/dL   Comment: REPEATED TO VERIFY     CRITICAL RESULT CALLED TO, READ BACK BY AND VERIFIED WITH:     K SANDERS @2050  01/22/13 BY S GEZAHEGN   HCT 18.9 (*) 36.0 - 46.0 %   MCV 78.8  78.0 - 100.0 fL   MCH 27.9  26.0 - 34.0 pg   MCHC 35.4  30.0 - 36.0 g/dL   RDW 52.8 (*) 41.3 - 24.4 %   Platelets 112 (*) 150 - 400 K/uL   Comment:  SPECIMEN CHECKED FOR CLOTS     REPEATED TO VERIFY     CONSISTENT WITH PREVIOUS  RESULT  BLOOD GAS, ARTERIAL     Status: Abnormal   Collection Time    01/22/13 11:55 PM      Result Value Range   FIO2 0.21     Delivery systems ROOM AIR     pH, Arterial 7.457 (*) 7.350 - 7.450   pCO2 arterial 30.1 (*) 35.0 - 45.0 mmHg   pO2, Arterial 127.0 (*) 80.0 - 100.0 mmHg   Bicarbonate 21.0  20.0 - 24.0 mEq/L   TCO2 21.9  0 - 100 mmol/L   Acid-base deficit 2.1 (*) 0.0 - 2.0 mmol/L   O2 Saturation 100.0     Collection site PERIPHERAL ARTERIAL LINE     Drawn by RN     Sample type ARTERIAL     Allens test (pass/fail) PASS  PASS  APTT     Status: None   Collection Time    01/22/13 11:55 PM      Result Value Range   aPTT 32  24 - 37 seconds  PROTIME-INR     Status: Abnormal   Collection Time    01/22/13 11:55 PM      Result Value Range   Prothrombin Time 16.7 (*) 11.6 - 15.2 seconds   INR 1.39  0.00 - 1.49  COMPREHENSIVE METABOLIC PANEL     Status: Abnormal   Collection Time    01/22/13 11:55 PM      Result Value Range   Sodium 131 (*) 135 - 145 mEq/L   Potassium 4.0  3.5 - 5.1 mEq/L   Chloride 100  96 - 112 mEq/L   CO2 22  19 - 32 mEq/L   Glucose, Bld 162 (*) 70 - 99 mg/dL   BUN 16  6 - 23 mg/dL   Creatinine, Ser 1.61  0.50 - 1.10 mg/dL   Calcium 7.1 (*) 8.4 - 10.5 mg/dL   Total Protein 3.5 (*) 6.0 - 8.3 g/dL   Albumin 1.5 (*) 3.5 - 5.2 g/dL   AST 22  0 - 37 U/L   ALT 9  0 - 35 U/L   Alkaline Phosphatase 44  39 - 117 U/L   Total Bilirubin 1.6 (*) 0.3 - 1.2 mg/dL   GFR calc non Af Amer >90  >90 mL/min   GFR calc Af Amer >90  >90 mL/min   Comment:            The eGFR has been calculated     using the CKD EPI equation.     This calculation has not been     validated in all clinical     situations.     eGFR's persistently     <90 mL/min signify     possible Chronic Kidney Disease.  LACTIC ACID, PLASMA     Status: Abnormal   Collection Time    01/22/13 11:55 PM      Result  Value Range   Lactic Acid, Venous 2.6 (*) 0.5 - 2.2 mmol/L  TROPONIN I     Status: None   Collection Time    01/22/13 11:55 PM      Result Value Range   Troponin I <0.30  <0.30 ng/mL   Comment:            Due to the release kinetics of cTnI,     a negative result within the first hours     of the onset of symptoms does not rule out     myocardial infarction with certainty.  If myocardial infarction is still suspected,     repeat the test at appropriate intervals.  GLUCOSE, CAPILLARY     Status: Abnormal   Collection Time    01/23/13 12:20 AM      Result Value Range   Glucose-Capillary 151 (*) 70 - 99 mg/dL  CBC     Status: Abnormal   Collection Time    01/23/13  3:15 AM      Result Value Range   WBC 17.7 (*) 4.0 - 10.5 K/uL   RBC 2.97 (*) 3.87 - 5.11 MIL/uL   Hemoglobin 8.8 (*) 12.0 - 15.0 g/dL   Comment: DELTA CHECK NOTED     REPEATED TO VERIFY     POST TRANSFUSION SPECIMEN   HCT 24.1 (*) 36.0 - 46.0 %   MCV 81.1  78.0 - 100.0 fL   MCH 29.6  26.0 - 34.0 pg   MCHC 36.5 (*) 30.0 - 36.0 g/dL   RDW 16.1 (*) 09.6 - 04.5 %   Platelets 64 (*) 150 - 400 K/uL   Comment: DELTA CHECK NOTED     SPECIMEN CHECKED FOR CLOTS     REPEATED TO VERIFY     PLATELET COUNT CONFIRMED BY SMEAR  GLUCOSE, CAPILLARY     Status: None   Collection Time    01/23/13  4:03 AM      Result Value Range   Glucose-Capillary 98  70 - 99 mg/dL  CBC     Status: Abnormal   Collection Time    01/23/13  5:15 AM      Result Value Range   WBC 16.5 (*) 4.0 - 10.5 K/uL   RBC 2.88 (*) 3.87 - 5.11 MIL/uL   Hemoglobin 8.4 (*) 12.0 - 15.0 g/dL   HCT 40.9 (*) 81.1 - 91.4 %   MCV 80.6  78.0 - 100.0 fL   MCH 29.2  26.0 - 34.0 pg   MCHC 36.2 (*) 30.0 - 36.0 g/dL   RDW 78.2 (*) 95.6 - 21.3 %   Platelets 64 (*) 150 - 400 K/uL   Comment: REPEATED TO VERIFY     SPECIMEN CHECKED FOR CLOTS     CONSISTENT WITH PREVIOUS RESULT  DIC (DISSEMINATED INTRAVASCULAR COAGULATION) PANEL     Status: Abnormal   Collection  Time    01/23/13  5:15 AM      Result Value Range   Prothrombin Time 14.9  11.6 - 15.2 seconds   INR 1.19  0.00 - 1.49   aPTT 31  24 - 37 seconds   Fibrinogen 418  204 - 475 mg/dL   D-Dimer, Quant 0.86 (*) 0.00 - 0.48 ug/mL-FEU   Comment:            AT THE INHOUSE ESTABLISHED CUTOFF     VALUE OF 0.48 ug/mL FEU,     THIS ASSAY HAS BEEN DOCUMENTED     IN THE LITERATURE TO HAVE     A SENSITIVITY AND NEGATIVE     PREDICTIVE VALUE OF AT LEAST     98 TO 99%.  THE TEST RESULT     SHOULD BE CORRELATED WITH     AN ASSESSMENT OF THE CLINICAL     PROBABILITY OF DVT / VTE.   Platelets 67 (*) 150 - 400 K/uL   Comment: SPECIMEN CHECKED FOR CLOTS     REPEATED TO VERIFY     CONSISTENT WITH PREVIOUS RESULT   Smear Review NO SCHISTOCYTES SEEN    COMPREHENSIVE METABOLIC PANEL  Status: Abnormal   Collection Time    01/23/13  5:15 AM      Result Value Range   Sodium 132 (*) 135 - 145 mEq/L   Potassium 4.1  3.5 - 5.1 mEq/L   Chloride 102  96 - 112 mEq/L   CO2 24  19 - 32 mEq/L   Glucose, Bld 111 (*) 70 - 99 mg/dL   BUN 17  6 - 23 mg/dL   Creatinine, Ser 8.46  0.50 - 1.10 mg/dL   Calcium 7.4 (*) 8.4 - 10.5 mg/dL   Total Protein 3.7 (*) 6.0 - 8.3 g/dL   Albumin 1.5 (*) 3.5 - 5.2 g/dL   AST 21  0 - 37 U/L   ALT 9  0 - 35 U/L   Alkaline Phosphatase 44  39 - 117 U/L   Total Bilirubin 1.3 (*) 0.3 - 1.2 mg/dL   GFR calc non Af Amer >90  >90 mL/min   GFR calc Af Amer >90  >90 mL/min   Comment:            The eGFR has been calculated     using the CKD EPI equation.     This calculation has not been     validated in all clinical     situations.     eGFR's persistently     <90 mL/min signify     possible Chronic Kidney Disease.  TROPONIN I     Status: None   Collection Time    01/23/13  5:15 AM      Result Value Range   Troponin I <0.30  <0.30 ng/mL   Comment:            Due to the release kinetics of cTnI,     a negative result within the first hours     of the onset of symptoms does  not rule out     myocardial infarction with certainty.     If myocardial infarction is still suspected,     repeat the test at appropriate intervals.  PREPARE FRESH FROZEN PLASMA     Status: None   Collection Time    01/23/13  7:09 AM      Result Value Range   Unit Number [id number]     Blood Component Type THAWED PLASMA     Unit division 00     Status of Unit ALLOCATED     Transfusion Status OK TO TRANSFUSE     Unit Number [id number]     Blood Component Type THAWED PLASMA     Unit division 00     Status of Unit ALLOCATED     Transfusion Status OK TO TRANSFUSE     Unit Number [id number]     Blood Component Type THAWED PLASMA     Unit division 00     Status of Unit ALLOCATED     Transfusion Status OK TO TRANSFUSE    GLUCOSE, CAPILLARY     Status: Abnormal   Collection Time    01/23/13  9:37 AM      Result Value Range   Glucose-Capillary 112 (*) 70 - 99 mg/dL  CBC WITH DIFFERENTIAL     Status: Abnormal   Collection Time    01/23/13  9:52 AM      Result Value Range   WBC 14.5 (*) 4.0 - 10.5 K/uL   RBC 2.16 (*) 3.87 - 5.11 MIL/uL   Hemoglobin 6.3 (*) 12.0 - 15.0 g/dL   Comment:  REPEATED TO VERIFY     CRITICAL RESULT CALLED TO, READ BACK BY AND VERIFIED WITH:     TERESA CRITE,RN AT 1012 01/23/13 BY ZPERRY.   HCT 17.7 (*) 36.0 - 46.0 %   MCV 81.9  78.0 - 100.0 fL   MCH 29.2  26.0 - 34.0 pg   MCHC 35.6  30.0 - 36.0 g/dL   RDW 53.6  64.4 - 03.4 %   Platelets 70 (*) 150 - 400 K/uL   Neutrophils Relative % 69  43 - 77 %   Lymphocytes Relative 19  12 - 46 %   Monocytes Relative 12  3 - 12 %   Eosinophils Relative 0  0 - 5 %   Basophils Relative 0  0 - 1 %   Neutro Abs 10.0 (*) 1.7 - 7.7 K/uL   Lymphs Abs 2.8  0.7 - 4.0 K/uL   Monocytes Absolute 1.7 (*) 0.1 - 1.0 K/uL   Eosinophils Absolute 0.0  0.0 - 0.7 K/uL   Basophils Absolute 0.0  0.0 - 0.1 K/uL   RBC Morphology BASOPHILIC STIPPLING     Comment: POLYCHROMASIA PRESENT   WBC Morphology       Value: MODERATE  LEFT SHIFT (>5% METAS AND MYELOS,OCC PRO NOTED)  COMPREHENSIVE METABOLIC PANEL     Status: Abnormal   Collection Time    01/23/13  9:52 AM      Result Value Range   Sodium 134 (*) 135 - 145 mEq/L   Potassium 4.3  3.5 - 5.1 mEq/L   Chloride 104  96 - 112 mEq/L   CO2 21  19 - 32 mEq/L   Glucose, Bld 134 (*) 70 - 99 mg/dL   BUN 19  6 - 23 mg/dL   Creatinine, Ser 7.42  0.50 - 1.10 mg/dL   Calcium 6.6 (*) 8.4 - 10.5 mg/dL   Total Protein 3.3 (*) 6.0 - 8.3 g/dL   Albumin 1.4 (*) 3.5 - 5.2 g/dL   AST 19  0 - 37 U/L   ALT 8  0 - 35 U/L   Alkaline Phosphatase 36 (*) 39 - 117 U/L   Total Bilirubin 0.9  0.3 - 1.2 mg/dL   GFR calc non Af Amer 89 (*) >90 mL/min   GFR calc Af Amer >90  >90 mL/min   Comment:            The eGFR has been calculated     using the CKD EPI equation.     This calculation has not been     validated in all clinical     situations.     eGFR's persistently     <90 mL/min signify     possible Chronic Kidney Disease.  APTT     Status: None   Collection Time    01/23/13  9:52 AM      Result Value Range   aPTT 34  24 - 37 seconds  PROTIME-INR     Status: Abnormal   Collection Time    01/23/13  9:52 AM      Result Value Range   Prothrombin Time 16.2 (*) 11.6 - 15.2 seconds   INR 1.33  0.00 - 1.49  DIC (DISSEMINATED INTRAVASCULAR COAGULATION) PANEL     Status: Abnormal   Collection Time    01/23/13  9:52 AM      Result Value Range   Prothrombin Time 16.0 (*) 11.6 - 15.2 seconds   INR 1.31  0.00 - 1.49  aPTT 34  24 - 37 seconds   Fibrinogen 354  204 - 475 mg/dL   D-Dimer, Quant 1.61 (*) 0.00 - 0.48 ug/mL-FEU   Comment:            AT THE INHOUSE ESTABLISHED CUTOFF     VALUE OF 0.48 ug/mL FEU,     THIS ASSAY HAS BEEN DOCUMENTED     IN THE LITERATURE TO HAVE     A SENSITIVITY AND NEGATIVE     PREDICTIVE VALUE OF AT LEAST     98 TO 99%.  THE TEST RESULT     SHOULD BE CORRELATED WITH     AN ASSESSMENT OF THE CLINICAL     PROBABILITY OF DVT / VTE.    Platelets PLATELETS APPEAR ADEQUATE  150 - 400 K/uL   Comment: SPECIMEN CHECKED FOR CLOTS   Smear Review NO SCHISTOCYTES SEEN    LACTIC ACID, PLASMA     Status: Abnormal   Collection Time    01/23/13  9:52 AM      Result Value Range   Lactic Acid, Venous 2.3 (*) 0.5 - 2.2 mmol/L  PROCALCITONIN     Status: None   Collection Time    01/23/13  9:52 AM      Result Value Range   Procalcitonin 3.33     Comment:            Interpretation:     PCT > 2 ng/mL:     Systemic infection (sepsis) is likely,     unless other causes are known.     (NOTE)             ICU PCT Algorithm               Non ICU PCT Algorithm        ----------------------------     ------------------------------             PCT < 0.25 ng/mL                 PCT < 0.1 ng/mL         Stopping of antibiotics            Stopping of antibiotics           strongly encouraged.               strongly encouraged.        ----------------------------     ------------------------------           PCT level decrease by               PCT < 0.25 ng/mL           >= 80% from peak PCT           OR PCT 0.25 - 0.5 ng/mL          Stopping of antibiotics                                                 encouraged.         Stopping of antibiotics               encouraged.        ----------------------------     ------------------------------           PCT level decrease by  PCT >= 0.25 ng/mL           < 80% from peak PCT            AND PCT >= 0.5 ng/mL            Continuing antibiotics                                                  encouraged.           Continuing antibiotics                encouraged.        ----------------------------     ------------------------------         PCT level increase compared          PCT > 0.5 ng/mL             with peak PCT AND              PCT >= 0.5 ng/mL             Escalation of antibiotics                                              strongly encouraged.          Escalation of antibiotics             strongly encouraged.  TYPE AND SCREEN     Status: None   Collection Time    01/23/13 10:15 AM      Result Value Range   ABO/RH(D) A POS     Antibody Screen PENDING     Sample Expiration 01/26/2013     Unit Number [id number]     Blood Component Type RED CELLS,LR     Unit division 00     Status of Unit ISSUED     Unit tag comment VERBAL ORDERS PER DR Harold Mattes     Transfusion Status OK TO TRANSFUSE     Crossmatch Result PENDING     Unit Number [id number]     Blood Component Type RED CELLS,LR     Unit division 00     Status of Unit ISSUED     Unit tag comment VERBAL ORDERS PER DR Suhaylah Wampole     Transfusion Status OK TO TRANSFUSE     Crossmatch Result PENDING     Unit Number [id number]     Blood Component Type RED CELLS,LR     Unit division 00     Status of Unit ISSUED     Unit tag comment VERBAL ORDERS PER DR Iara Monds     Transfusion Status OK TO TRANSFUSE     Crossmatch Result PENDING     Unit Number [id number]     Blood Component Type RED CELLS,LR     Unit division 00     Status of Unit ISSUED     Unit tag comment VERBAL ORDERS PER DR Maham Quintin     Transfusion Status OK TO TRANSFUSE     Crossmatch Result PENDING    PREPARE FRESH FROZEN PLASMA     Status: None   Collection Time    01/23/13 10:30 AM      Result Value Range  Unit Number [id number]     Blood Component Type THAWED PLASMA     Unit division 00     Status of Unit ISSUED     Unit tag comment VERBAL ORDERS PER DR WRIGHT     Transfusion Status OK TO TRANSFUSE     Unit Number [id number]     Blood Component Type THAWED PLASMA     Unit division 00     Status of Unit ISSUED     Unit tag comment VERBAL ORDERS PER DR WRIGHT     Transfusion Status OK TO TRANSFUSE     Unit Number [id number]     Blood Component Type THAWED PLASMA     Unit division 00     Status of Unit ALLOCATED     Transfusion Status OK TO TRANSFUSE     Unit Number [id number]     Blood Component Type THAWED  PLASMA     Unit division 00     Status of Unit ALLOCATED     Transfusion Status OK TO TRANSFUSE    POCT I-STAT 3, BLOOD GAS (G3+)     Status: Abnormal   Collection Time    01/23/13 10:39 AM      Result Value Range   pH, Arterial 7.429  7.350 - 7.450   pCO2 arterial 36.2  35.0 - 45.0 mmHg   pO2, Arterial 350.0 (*) 80.0 - 100.0 mmHg   Bicarbonate 23.8  20.0 - 24.0 mEq/L   TCO2 25  0 - 100 mmol/L   O2 Saturation 100.0     Patient temperature 99.8 F     Collection site ARTERIAL LINE     Drawn by Operator     Sample type ARTERIAL      US Ob Limited  01/21/2013   OBSTETRICAL ULTRASOUND: This exam was performed within a [facility]. The OB US report was generated in the [facility] system, and faxed to the ordering physician.   This report is also available in [employer] and in the [employer]. See [facility] Obstetric US report.  Portable Chest Xray  01/23/2013   *RADIOLOGY REPORT*  Clinical Data: Endotracheal tube placement  PORTABLE CHEST - 1 VIEW  Comparison: 01/22/2013  Findings:  Grossly unchanged cardiac silhouette and mediastinal contours. Interval intubation with endotracheal tube overlying the tracheal air column with tip approximately 3 cm above the carina.  Enteric tube tip and side port project below the left hemidiaphragm.  An esophageal pH probe projects over the thoracic inlet.  Stable positioning of left jugular approach central venous catheter with tip overlying the mid SVC.  No supine evidence of pneumothorax or pleural effusion.  Worsening left basilar/retrocardiac heterogeneous / consolidative opacities.  No definite evidence of edema.  Unchanged bones.  IMPRESSION: 1.  Appropriately positioned support apparatus as above.  No supine evidence of pneumothorax. 2.  Worsening left basilar/retrocardiac opacity, favored to represent atelectasis.   Original Report Authenticated By: Tacey Ruiz, MD   Dg Chest Port 1 View  01/22/2013   *RADIOLOGY REPORT*   Clinical Data: Central line placement  PORTABLE CHEST - 1 VIEW  Comparison: None.  Findings: Left IJ central line tip terminates over the mid SVC. Lung volumes are low with crowding of the bronchovascular markings. Patchy retrocardiac airspace opacity is noted.  Heart size is normal.  No pleural effusion. No pneumothorax.  IMPRESSION: Appropriately positioned left IJ central line.  Patchy left greater than right airspace opacity, likely atelectasis.  Aspiration or early  pneumonia at the left lung base could have a similar appearance.   Original Report Authenticated By: Christiana Pellant, M.D.    Review of Systems  Unable to perform ROS: intubated   Blood pressure 76/42, pulse 125, temperature 99.1 F (37.3 C), temperature source Oral, resp. rate 28, height 5\' 1"  (1.549 m), weight 163 lb 14.4 oz (74.345 kg), last menstrual period 03/22/2012, SpO2 99.00%. Physical Exam  Vitals reviewed. GI: She exhibits distension. Bowel sounds are absent. There is tenderness.      Assessment/Plan: Intraabdominal hemorrhage  She is bleeding significantly.  coags normal, platelets a little low, hct low.  I think she needs to go to or now for exploration, possible packing, possible angio.  I have told her family that this could be fatal.  Zaire Levesque 01/23/2013, 10:47 AM

## 2013-01-23 NOTE — OR Nursing (Signed)
Care transferred to  [facility] Minor RN care-link at 0900.

## 2013-01-23 NOTE — Progress Notes (Signed)
Sedation   Fentanyl ordered

## 2013-01-23 NOTE — H&P (Addendum)
PULMONARY  / CRITICAL CARE MEDICINE TRANSFER NOTE  Name: Mackenzie [name] MRN: [id number] DOB: 1985/12/20    ADMISSION DATE:  01/21/2013 CONSULTATION DATE:  5/23  REFERRING MD :  Dr Jolayne Panther PRIMARY SERVICE: OB   CHIEF COMPLAINT:  shock  BRIEF PATIENT DESCRIPTION: 27 yr old 41 weeks pregant, STAT Csection after failed labor, uterine rupture, shock post op.  Pt continued to bleed overnight and found in OR 5/24 AM significant retroperitoneal bleed.   SIGNIFICANT EVENTS / STUDIES:  5/23- uterine rupture, massive blood loss and prbc Tx, shock 5/24- retroperitoneal bleed significant  LINES / TUBES: Left ij 5/23>>>  CULTURES: none  ANTIBIOTICS: Zosyn 5/23 (concern infected amniotic)>>>  HISTORY OF PRESENT ILLNESS:  27 yr old Mackenzie [name], labor attempted failed, to OR STAT c section, uterine rupture, blood loss 4600, Tx 9 units PRBC, plat, ffp. S/p hysterectomy. Thrombocytopenia. IN AICU with continued shock, on neo.Seen initially in ICU at [facility] on 5/23 with CVL insertion.  Pt then taken to OR AM 5/24 and found ongoing retroperitoneal bleeding and called from OR to tfr pt [facility] .  On arrival ,pt still appears to be bleeding and in shock state    PAST MEDICAL HISTORY :  Past Medical History  Diagnosis Date  . Varicose veins   . Hx of macrosomia in infant in prior pregnancy, currently pregnant    Past Surgical History  Procedure Laterality Date  . Cesarean section     Prior to Admission medications   Medication Sig Start Date End Date Taking? Authorizing Provider  Prenatal Vit-Fe Fumarate-FA (PRENATAL MULTIVITAMIN) TABS Take 1 tablet by mouth daily at 12 noon.   Yes Historical Provider, MD   No Known Allergies  FAMILY HISTORY:  Family History  Problem Relation Age of Onset  . Alcohol abuse Neg Hx   . Arthritis Neg Hx   . Asthma Neg Hx   . Birth defects Neg Hx   . Cancer Neg Hx   . COPD Neg Hx   . Depression Neg Hx   . Diabetes Neg Hx   . Drug abuse Neg Hx   .  Early death Neg Hx   . Hearing loss Neg Hx   . Heart disease Neg Hx   . Kidney disease Neg Hx   . Hypertension Neg Hx   . Hyperlipidemia Neg Hx   . Learning disabilities Neg Hx   . Mental illness Neg Hx   . Mental retardation Neg Hx   . Miscarriages / Stillbirths Neg Hx   . Stroke Neg Hx   . Vision loss Neg Hx    SOCIAL HISTORY:  reports that she has never smoked. She has never used smokeless tobacco. She reports that she does not drink alcohol or use illicit drugs.  REVIEW OF SYSTEMS:  Unable , language barrier, shock state  SUBJECTIVE:  Pt is in active shock state and hemovacs filling rapidly  VITAL SIGNS: Temp:  [98.4 F (36.9 C)-102 F (38.9 C)] 99.1 F (37.3 C) (05/24 0230) Pulse Rate:  [42-148] 125 (05/24 0700) Resp:  [0-34] 28 (05/24 0600) BP: (52-105)/(29-68) 76/42 mmHg (05/23 1715) SpO2:  [80 %-100 %] 99 % (05/24 0630) Arterial Line BP: (65-139)/(29-76) 98/66 mmHg (05/24 0700) FiO2 (%):  [100 %] 100 % (05/24 0900) Weight:  [74.345 kg (163 lb 14.4 oz)] 74.345 kg (163 lb 14.4 oz) (05/23 1853) HEMODYNAMICS: CVP:  [0 mmHg-4 mmHg] 1 mmHg Shock state VENTILATOR SETTINGS: Vent Mode:  [-] PRVC FiO2 (%):  [100 %]  100 % Set Rate:  [14 bmp] 14 bmp Vt Set:  [350 mL] 350 mL PEEP:  [5 cmH20] 5 cmH20 Plateau Pressure:  [21 cmH20] 21 cmH20 INTAKE / OUTPUT: Intake/Output     05/23 0701 - 05/24 0700 05/24 0701 - 05/25 0700   I.V. (mL/kg) 6359.1 (85.5) 300 (4)   Blood 3647 916   IV Piggyback 100    Total Intake(mL/kg) 10106.1 (135.9) 1216 (16.4)   Urine (mL/kg/hr) 1754 (1) 200 (0.9)   Drains 1513 (0.8)    Blood 4600 (2.6) 600 (2.7)   Total Output 7867 800   Net +2239.1 +416          PHYSICAL EXAMINATION: General:  Orally intubated, agitated  Neuro:  Sedated  HEENT:  No JVD at all Cardiovascular:  s1 s2 rrt disant Lungs: ANterior clear Abdomen:  ,distended abdomen Skin:  No rash  LABS:  Recent Labs Lab 01/22/13 1210  01/22/13 1349 01/22/13 1745  01/22/13 1854 01/22/13 2020 01/22/13 2355 01/23/13 0315 01/23/13 0515  HGB  --   < > 8.0* 7.4*  --  6.7*  --  8.8* 8.4*  WBC  --   < > 14.4* 17.7*  --  18.4*  --  17.7* 16.5*  PLT 114*  < > 67* 124*  124*  --  112*  --  64* 64*  67*  NA 135  --   --  133*  --  131* 131*  --  132*  K 3.9  --   --  3.9  --  3.9 4.0  --  4.1  CL 103  --   --  102  --  101 100  --  102  CO2 24  --   --  23  --  23 22  --  24  GLUCOSE 104*  --   --  148*  --  160* 162*  --  111*  BUN 8  --   --  12  --  14 16  --  17  CREATININE 0.84  --   --  0.77  --  0.84 0.87  --  0.84  CALCIUM 7.8*  --   --  7.4*  --  7.2* 7.1*  --  7.4*  MG  --   --   --  1.1*  --   --   --   --   --   PHOS  --   --   --  5.0*  --   --   --   --   --   AST  --   --   --  18  --   --  22  --  21  ALT  --   --   --  9  --   --  9  --  9  ALKPHOS  --   --   --  43  --   --  44  --  44  BILITOT  --   --   --  3.1*  --   --  1.6*  --  1.3*  PROT  --   --   --  3.4*  --   --  3.5*  --  3.7*  ALBUMIN  --   --   --  1.6*  --   --  1.5*  --  1.5*  APTT 27  --   --  92*  --   --  32  --  31  INR  1.20  --   --  1.48  --   --  1.39  --  1.19  LATICACIDVEN  --   --   --  2.9*  --   --  2.6*  --   --   TROPONINI  --   --   --   --   --   --  <0.30  --  <0.30  PHART  --   --   --   --  7.469*  --  7.457*  --   --   PCO2ART  --   --   --   --  30.7*  --  30.1*  --   --   PO2ART  --   --   --   --  112.0*  --  127.0*  --   --   < > = values in this interval not displayed.  Recent Labs Lab 01/22/13 1951 01/23/13 0020 01/23/13 0403 01/23/13 0937  GLUCAP 124* 151* 98 112*    CXR: none  ASSESSMENT / PLAN:  PULMONARY A: vent dep s/p surgery P:   Full vent for now  F/u cxr   CARDIOVASCULAR A: hemorrhagic shock  P:  Vasopressors More blood products prob OR again  RENAL A:  No issues P:   monitor  GASTROINTESTINAL A:  npo P:   Cont PPI NPO NG LIS  HEMATOLOGIC A:   massive blood loss and TX,  P:  Transfuse more  blood , ffp and plt. Needs OR likely again>>surgery consulted   INFECTIOUS A:  Concern GYN infection amniotic P:   Cont zosyn  ENDOCRINE A:  R/o rel AI Add cbg P:   Cont  cbg  NEUROLOGIC A:  Pain, on vent P:   Prn fentanyl and versed  TODAY'S SUMMARY:s/p csection then uterine bleed/rupture then OR hysterectomy, now retroperitoneal bleeding and ongoing hemorrhage Likely more OR time needed with gen surg Give more products,  Family updated at bedside  I have personally obtained a history, examined the patient, evaluated laboratory and imaging results, formulated the assessment and plan and placed orders. CRITICAL CARE: The patient is critically ill with multiple organ systems failure and requires high complexity decision making for assessment and support, frequent evaluation and titration of therapies, application of advanced monitoring technologies and extensive interpretation of multiple databases. Critical Care Time devoted to patient care services described in this note is 60 minutes.   Dorcas Carrow Beeper  [phone]  Cell  [phone]  If no response or cell goes to voicemail, call beeper [phone] Pulmonary and Critical Care Medicine [facility] Pager: [phone]  01/23/2013, 9:57 AM

## 2013-01-23 NOTE — Transfer of Care (Signed)
Immediate Anesthesia Transfer of Care Note  Patient: Mackenzie [name]  Procedure(s) Performed: Procedure(s): SALPINGO OOPHORECTOMY (N/A)  Patient Location: Pt transferred via Carelink to critical care ICU/[facility]  Anesthesia Type:General  Level of Consciousness: sedated and unresponsive  Airway & Oxygen Therapy: Patient remains intubated per anesthesia plan and Patient placed on Ventilator (see vital sign flow sheet for setting)  Post-op Assessment: Post -op Vital signs reviewed and stable  Post vital signs: Reviewed and stable  Complications: No apparent anesthesia complications

## 2013-01-23 NOTE — Anesthesia Preprocedure Evaluation (Addendum)
Anesthesia Evaluation  Patient identified by MRN, date of birth, ID band Patient awake    Reviewed: Allergy & Precautions, H&P , NPO status , Patient's Chart, lab work & pertinent test results, reviewed documented beta blocker date and time   Airway Mallampati: II TM Distance: >3 FB Neck ROM: full    Dental no notable dental hx.    Pulmonary neg pulmonary ROS,  breath sounds clear to auscultation  Pulmonary exam normal       Cardiovascular negative cardio ROS  Rhythm:regular Rate:Normal     Neuro/Psych negative neurological ROS  negative psych ROS   GI/Hepatic negative GI ROS, Neg liver ROS,   Endo/Other  negative endocrine ROS  Renal/GU      Musculoskeletal   Abdominal   Peds  Hematology negative hematology ROS (+) hemorrhage   Anesthesia Other Findings Varicose veins     Hx of macrosomia in infant in prior pregnancy, post op C section on 5/22 and hysterectomy on 5/23 for uterine rupture and hemorrhage.  Hct 17 after 11 units prbcs and 4 units FFP and 1 pack platelets at [facility] .  No past medical history per husband, interpreter was present in ICU.  No anesthetic complications per husband.  Existing ETT, central line and arterial line.  Verified placement of ETT prior to transfer from ICU to OR.  Carlynn Herald, CRNA    Reproductive/Obstetrics (+) Pregnancy                         Anesthesia Physical Anesthesia Plan  ASA: III and emergent  Anesthesia Plan:    Post-op Pain Management:    Induction:   Airway Management Planned:   Additional Equipment:   Intra-op Plan:   Post-operative Plan:   Informed Consent:   Plan Discussed with:   Anesthesia Plan Comments:         Anesthesia Quick Evaluation

## 2013-01-23 NOTE — Progress Notes (Signed)
Leaving to OR via bed with [facility], anesthesia and RN. Pt. Alert, oriented, calm and cooperative. Pt's husband meeting them downstairs by OR

## 2013-01-23 NOTE — Progress Notes (Signed)
Report given to [name] on 2100 at [facility].

## 2013-01-24 ENCOUNTER — Inpatient Hospital Stay (HOSPITAL_COMMUNITY): Payer: Medicaid Other

## 2013-01-24 DIAGNOSIS — J95821 Acute postprocedural respiratory failure: Secondary | ICD-10-CM | POA: Diagnosis not present

## 2013-01-24 DIAGNOSIS — R58 Hemorrhage, not elsewhere classified: Secondary | ICD-10-CM

## 2013-01-24 LAB — TYPE AND SCREEN
ABO/RH(D): A POS
Unit division: 0
Unit division: 0
Unit division: 0
Unit division: 0
Unit division: 0
Unit division: 0
Unit division: 0
Unit division: 0
Unit division: 0
Unit division: 0

## 2013-01-24 LAB — PREPARE PLATELET PHERESIS
Unit division: 0
Unit division: 0

## 2013-01-24 LAB — COMPREHENSIVE METABOLIC PANEL
ALT: 41 U/L — ABNORMAL HIGH (ref 0–35)
Albumin: 1.4 g/dL — ABNORMAL LOW (ref 3.5–5.2)
Alkaline Phosphatase: 39 U/L (ref 39–117)
Potassium: 4.5 mEq/L (ref 3.5–5.1)
Sodium: 134 mEq/L — ABNORMAL LOW (ref 135–145)
Total Protein: 3.9 g/dL — ABNORMAL LOW (ref 6.0–8.3)

## 2013-01-24 LAB — CBC WITH DIFFERENTIAL/PLATELET
Basophils Absolute: 0 10*3/uL (ref 0.0–0.1)
Basophils Absolute: 0 10*3/uL (ref 0.0–0.1)
Basophils Relative: 0 % (ref 0–1)
Basophils Relative: 0 % (ref 0–1)
Eosinophils Absolute: 0 10*3/uL (ref 0.0–0.7)
Eosinophils Relative: 0 % (ref 0–5)
Eosinophils Relative: 0 % (ref 0–5)
HCT: 24.9 % — ABNORMAL LOW (ref 36.0–46.0)
HCT: 23 % — ABNORMAL LOW (ref 36.0–46.0)
HCT: 22.7 % — ABNORMAL LOW (ref 36.0–46.0)
Lymphs Abs: 1.4 10*3/uL (ref 0.7–4.0)
Lymphs Abs: 1.4 10*3/uL (ref 0.7–4.0)
Lymphs Abs: 1.7 10*3/uL (ref 0.7–4.0)
Lymphs Abs: 1.8 10*3/uL (ref 0.7–4.0)
MCH: 29.7 pg (ref 26.0–34.0)
MCH: 30 pg (ref 26.0–34.0)
MCHC: 36.2 g/dL — ABNORMAL HIGH (ref 30.0–36.0)
MCV: 83 fL (ref 78.0–100.0)
MCV: 85.2 fL (ref 78.0–100.0)
MCV: 85.7 fL (ref 78.0–100.0)
Monocytes Absolute: 2.1 10*3/uL — ABNORMAL HIGH (ref 0.1–1.0)
Monocytes Relative: 11 % (ref 3–12)
Monocytes Relative: 11 % (ref 3–12)
Monocytes Relative: 10 % (ref 3–12)
Neutro Abs: 12.6 10*3/uL — ABNORMAL HIGH (ref 1.7–7.7)
Neutro Abs: 15.3 10*3/uL — ABNORMAL HIGH (ref 1.7–7.7)
Neutro Abs: 16.5 10*3/uL — ABNORMAL HIGH (ref 1.7–7.7)
Neutrophils Relative %: 79 % — ABNORMAL HIGH (ref 43–77)
Platelets: 102 10*3/uL — ABNORMAL LOW (ref 150–400)
RBC: 3.17 MIL/uL — ABNORMAL LOW (ref 3.87–5.11)
RBC: 2.65 MIL/uL — ABNORMAL LOW (ref 3.87–5.11)
RDW: 14.7 % (ref 11.5–15.5)
RDW: 14.9 % (ref 11.5–15.5)
WBC: 15.7 10*3/uL — ABNORMAL HIGH (ref 4.0–10.5)
WBC: 19.1 10*3/uL — ABNORMAL HIGH (ref 4.0–10.5)
WBC: 20.3 10*3/uL — ABNORMAL HIGH (ref 4.0–10.5)

## 2013-01-24 LAB — BASIC METABOLIC PANEL
BUN: 19 mg/dL (ref 6–23)
CO2: 23 mEq/L (ref 19–32)
Calcium: 7.2 mg/dL — ABNORMAL LOW (ref 8.4–10.5)
Calcium: 7.6 mg/dL — ABNORMAL LOW (ref 8.4–10.5)
Chloride: 104 mEq/L (ref 96–112)
Creatinine, Ser: 0.74 mg/dL (ref 0.50–1.10)
Creatinine, Ser: 0.76 mg/dL (ref 0.50–1.10)
GFR calc Af Amer: >90 mL/min (ref >90)
GFR calc non Af Amer: >90 mL/min (ref >90)
GFR calc non Af Amer: >90 mL/min (ref >90)
Sodium: 133 mEq/L — ABNORMAL LOW (ref 135–145)

## 2013-01-24 LAB — PREPARE FRESH FROZEN PLASMA
Unit division: 0
Unit division: 0
Unit division: 0
Unit division: 0
Unit division: 0

## 2013-01-24 LAB — PROCALCITONIN: Procalcitonin: 4.46 ng/mL

## 2013-01-24 LAB — GLUCOSE, CAPILLARY
Glucose-Capillary: 116 mg/dL — ABNORMAL HIGH (ref 70–99)
Glucose-Capillary: 86 mg/dL (ref 70–99)

## 2013-01-24 MED ORDER — SODIUM CHLORIDE 0.9 % IV BOLUS (SEPSIS)
1000.0000 mL | Freq: Once | INTRAVENOUS | Status: AC
  Administered 2013-01-24: 1000 mL via INTRAVENOUS

## 2013-01-24 NOTE — Progress Notes (Signed)
eLink Physician-Brief Progress Note Patient Name: Mackenzie [name] DOB: 1986/05/22 MRN: [id number]  Date of Service  01/24/2013   HPI/Events of Note   Tachycardic heart rate 131. Blood pressures normal 120 systolic with a mean artery pressure of 87. And CVP is only 2.    Recent Labs Lab 01/23/13 0515 01/23/13 0952 01/23/13 1311 01/23/13 2030 01/24/13 0030  HGB 8.4* 6.3* 5.3* 9.9* 9.4*   Hemoglobin appears okay and no obvious bleeding   eICU Interventions   1 L fluid bolus  Recheck labs in 2 hours    Intervention Category Major Interventions: Hypovolemia - evaluation and treatment with fluids  Mcclain Shall 01/24/2013, 2:23 AM

## 2013-01-24 NOTE — H&P (Signed)
PULMONARY  / CRITICAL CARE MEDICINE TRANSFER NOTE  Name: Mackenzie [name] MRN: [id number] DOB: Apr 19, 1986    ADMISSION DATE:  01/21/2013 CONSULTATION DATE:  5/23  REFERRING MD :  Dr Jolayne Panther PRIMARY SERVICE: OB   CHIEF COMPLAINT:  shock  BRIEF PATIENT DESCRIPTION: 27 yr old 41 weeks pregant, STAT Csection after failed labor, uterine rupture, shock post op.  Pt continued to bleed overnight and found in OR 5/24 AM significant retroperitoneal bleed.   SIGNIFICANT EVENTS / STUDIES:  5/23- uterine rupture, massive blood loss and prbc Tx, shock 5/24- retroperitoneal bleed significant 5-24 to OR for wound vac 5-24 to IR for enolization of left iliac artery branch  LINES / TUBES: Left ij 5/23>>> 5-24 OTT>> 5-23 epidural>>  CULTURES: 5/25 Resp c/s>> 5/25 BC x 2    ANTIBIOTICS: Zosyn 5/23 (concern infected amniotic)>>>   SUBJECTIVE:  Sedated on vent. No longer bleeding  VITAL SIGNS: Temp:  [99 F (37.2 C)-102.4 F (39.1 C)] 100.4 F (38 C) (05/25 0735) Pulse Rate:  [108-139] 118 (05/25 0700) Resp:  [19-33] 23 (05/25 0700) BP: (94-129)/(64-84) 108/67 mmHg (05/25 0600) SpO2:  [99 %-100 %] 99 % (05/25 0700) Arterial Line BP: (101-138)/(69-97) 119/76 mmHg (05/25 0700) FiO2 (%):  [40 %-60 %] 40 % (05/25 0820) Weight:  [77.5 kg (170 lb 13.7 oz)] 77.5 kg (170 lb 13.7 oz) (05/25 0500) HEMODYNAMICS: CVP:  [2 mmHg-22 mmHg] 8 mmHg No longer on vasopressors 5/25 AM VENTILATOR SETTINGS: Vent Mode:  [-] PRVC FiO2 (%):  [40 %-60 %] 40 % Set Rate:  [14 bmp-28 bmp] 14 bmp Vt Set:  [350 mL] 350 mL PEEP:  [5 cmH20] 5 cmH20 Plateau Pressure:  [15 cmH20-18 cmH20] 15 cmH20 INTAKE / OUTPUT: Intake/Output     05/24 0701 - 05/25 0700 05/25 0701 - 05/26 0700   I.V. (mL/kg) 2003 (25.8)    Blood 3308    NG/GT 30    IV Piggyback 1150    Total Intake(mL/kg) 6491 (83.8)    Urine (mL/kg/hr) 1310 (0.7)    Emesis/NG output 250 (0.1)    Drains 3000 (1.6)    Blood 600 (0.3)    Total Output 5160      Net +1331            PHYSICAL EXAMINATION: General:  Orally intubated, sedated Neuro:  Intact HEENT:  No JVD  Cardiovascular:  s1 s2 rrt disant, ext perfused Lungs: coarse rhonchi bilat Abdomen:  Open midline incision with vac in place with old blood Skin:  No rash  LABS:  Recent Labs Lab 01/22/13 1210  01/22/13 1745  01/22/13 1854  01/22/13 2355  01/23/13 0515 01/23/13 0952 01/23/13 1039  01/23/13 2029 01/23/13 2030 01/24/13 0030 01/24/13 0435  HGB  --   < > 7.4*  --   --   < >  --   < > 8.4* 6.3*  --   < >  --  9.9* 9.4* 9.0*  WBC  --   < > 17.7*  --   --   < >  --   < > 16.5* 14.5*  --   < >  --  12.1* 14.8* 15.7*  PLT 114*  < > 124*  124*  --   --   < >  --   < > 64*  67* 70*  PLATELETS APPEAR ADEQUATE  --   < >  --  105* 102* 91*  NA 135  --  133*  --   --   < >  131*  --  132* 134*  --   --   --  131*  --  134*  K 3.9  --  3.9  --   --   < > 4.0  --  4.1 4.3  --   --   --  4.5  --  4.5  CL 103  --  102  --   --   < > 100  --  102 104  --   --   --  101  --  105  CO2 24  --  23  --   --   < > 22  --  24 21  --   --   --  23  --  20  GLUCOSE 104*  --  148*  --   --   < > 162*  --  111* 134*  --   --   --  122*  --  100*  BUN 8  --  12  --   --   < > 16  --  17 19  --   --   --  17  --  20  CREATININE 0.84  --  0.77  --   --   < > 0.87  --  0.84 0.88  --   --   --  0.66  --  0.77  CALCIUM 7.8*  --  7.4*  --   --   < > 7.1*  --  7.4* 6.6*  --   --   --  7.0*  --  6.8*  MG  --   --  1.1*  --   --   --   --   --   --   --   --   --   --   --   --   --   PHOS  --   --  5.0*  --   --   --   --   --   --   --   --   --   --   --   --   --   AST  --   < > 18  --   --   --  22  --  21 19  --   --   --   --   --  59*  ALT  --   < > 9  --   --   --  9  --  9 8  --   --   --   --   --  41*  ALKPHOS  --   < > 43  --   --   --  44  --  44 36*  --   --   --   --   --  39  BILITOT  --   < > 3.1*  --   --   --  1.6*  --  1.3* 0.9  --   --   --   --   --  0.9  PROT  --   < >  3.4*  --   --   --  3.5*  --  3.7* 3.3*  --   --   --   --   --  3.9*  ALBUMIN  --   < > 1.6*  --   --   --  1.5*  --  1.5* 1.4*  --   --   --   --   --  1.4*  APTT 27  --  92*  --   --   --  32  --  31 34  34  --   --   --   --   --   --   INR 1.20  --  1.48  --   --   --  1.39  --  1.19 1.33  1.31  --   --   --  1.06  --   --   LATICACIDVEN  --   --  2.9*  --   --   --  2.6*  --   --  2.3*  --   --   --   --   --   --   TROPONINI  --   --   --   --   --   --  <0.30  --  <0.30  --   --   --   --   --   --   --   PROCALCITON  --   --   --   --   --   --   --   --   --  3.33  --   --   --   --   --  4.46  PHART  --   --   --   < > 7.469*  --  7.457*  --   --   --  7.429  --  7.385  --   --   --   PCO2ART  --   --   --   < > 30.7*  --  30.1*  --   --   --  36.2  --  40.8  --   --   --   PO2ART  --   --   --   < > 112.0*  --  127.0*  --   --   --  350.0*  --  183.0*  --   --   --   < > = values in this interval not displayed.  Recent Labs Lab 01/23/13 1451 01/23/13 2000 01/24/13 0007 01/24/13 0412 01/24/13 0729  GLUCAP 122* 123* 116* 102* 87    Imaging: All imaging reviewed  ASSESSMENT / PLAN:  PULMONARY A: vent dep s/p surgery, high risk TRALI P:   Full vent for now , No extubation planned   CARDIOVASCULAR A: hemorrhagic shock 5-25 resolved and off pressors P:  blood products as needed   RENAL A:  No issues P:   monitor  GASTROINTESTINAL A:  npo P:   Cont PPI NPO NG LIS Follow up wound closure per CCS ongoing May need TPN  HEMATOLOGIC A:   massive blood loss and TX,  P:  Transfuse as needed. 5-25 stabilized  INFECTIOUS A:  Concern GYN infection amniotic P:   Cont zosyn  ENDOCRINE CBG (last 3)   Recent Labs  01/24/13 0007 01/24/13 0412 01/24/13 0729  GLUCAP 116* 102* 87     A:  R/o rel AI(cortisol 19) Add cbg P:   Cont  cbg  NEUROLOGIC A:  Pain, on vent, otherwise intact Needs Epidural removed. Anesthesia to remove 5-25 P:   Prn  fentanyl and versed  TODAY'S SUMMARY:s/p csection then uterine bleed/rupture then OR hysterectomy, now retroperitoneal bleeding and ongoing hemorrhage-524 to OR per GS for exploratory lap, packing , wound vac. Then to IR for embolization pf left iliac artery branch with hemostasis obtained. 5-25 stable . Off pressors.  Family updated at bedside  CC Caryl Bis  [phone]  Cell  [phone]  If no response or cell goes to voicemail, call beeper [phone]   01/24/2013, 10:31 AM

## 2013-01-24 NOTE — Progress Notes (Signed)
1 Day Post-Op  Subjective: Pt doing well post-op this AM.  No further blood requirements or pressors.  Vac with bloody output=3L.  Objective: Vital signs in last 24 hours: Temp:  [99 F (37.2 C)-102.4 F (39.1 C)] 100.4 F (38 C) (05/25 0735) Pulse Rate:  [108-139] 118 (05/25 0700) Resp:  [19-33] 23 (05/25 0700) BP: (94-129)/(64-84) 108/67 mmHg (05/25 0600) SpO2:  [99 %-100 %] 99 % (05/25 0700) Arterial Line BP: (101-138)/(69-97) 119/76 mmHg (05/25 0700) FiO2 (%):  [40 %-60 %] 40 % (05/25 0820) Weight:  [170 lb 13.7 oz (77.5 kg)] 170 lb 13.7 oz (77.5 kg) (05/25 0500)    Intake/Output from previous day: 05/24 0701 - 05/25 0700 In: 6491 [I.V.:2003; Blood:3308; NG/GT:30; IV Piggyback:1150] Out: 5160 [Urine:1310; Emesis/NG output:250; Drains:3000; Blood:600] Intake/Output this shift:    General appearance: alert and cooperative Resp: clear to auscultation bilaterally GI: soft, Vac in place with SS/bloody output Incision/Wound: Vac in place  Lab Results:   Recent Labs  01/24/13 0030 01/24/13 0435  WBC 14.8* 15.7*  HGB 9.4* 9.0*  HCT 26.0* 24.9*  PLT 102* 91*   BMET  Recent Labs  01/23/13 2030 01/24/13 0435  NA 131* 134*  K 4.5 4.5  CL 101 105  CO2 23 20  GLUCOSE 122* 100*  BUN 17 20  CREATININE 0.66 0.77  CALCIUM 7.0* 6.8*   PT/INR  Recent Labs  01/23/13 0952 01/23/13 2030  LABPROT 16.2*  16.0* 13.7  INR 1.33  1.31 1.06   ABG  Recent Labs  01/23/13 1039 01/23/13 2029  PHART 7.429 7.385  HCO3 23.8 23.9    Studies/Results: Ir Angiogram Pelvis Selective Or Supraselective  01/23/2013   *RADIOLOGY REPORT*  Clinical Data: Post C-section hemorrhage, post hysterectomy with persistent abdominal and pelvic bleeding.  Post open abdominal compartment decompression with packing at which time the patient was found to have a large left-sided the pelvic side wall hematoma. The patient is hemodynamically unstable, currently receiving blood products and on  vasopressors.  The patient transferred emergently from the operating room for a pelvic arteriogram.  1.  ULTRASOUND GUIDANCE FOR ARTERIAL ACCESS 2.  LEFT INTERNAL ILIAC ARTERIOGRAM 3.  MIDDLE RECTAL ARTERIOGRAM (3rd ORDER) WITH PERCUTANEOUS COIL EMBOLIZATION  Comparison: None  Intravenous Medications: None - the patient was brought emergently from the operating room under general anesthesia  Contrast: 60 ml Omnipaque-300  Fluoroscopy Time: 7 minutes, 6 seconds  Access:  Right common femoral artery; hemostasis achieved with deployment of an Exoseal device.  Complications: None immediate  Technique:  Informed written consent was obtained from the the patient's husband via the use of a Medical interpreter after a discussion of the risks, benefits and alternatives to treatment.  Questions regarding the procedure were encouraged and answered.  A timeout was performed prior to the initiation of the procedure.  The right groin was prepped and draped in the usual sterile fashion, and a sterile drape was applied covering the operative field.  Maximum barrier sterile technique with sterile gowns and gloves were used for the procedure.  A timeout was performed prior to the initiation of the procedure.  Local anesthesia was provided with 1% lidocaine.  The right femoral head was marked fluoroscopically.  Under ultrasound guidance, the right common femoral artery was accessed with a micropuncture kit after the overlying soft tissues were anesthetized with 1% lidocaine.  An ultrasound image was saved for documentation purposes.  The micropuncture sheath was exchanged for a 5 [place] vascular sheath over a Bentson wire.  A closure arteriogram was performed through the side of the sheath confirming access within the right common femoral artery.  Over a Bentson wire, a C2 catheter was advanced to the pelvic bifurcation where it was back bled and flushed.  The C2 catheter was then utilized to select the contralateral left internal  iliac artery and a left internal iliac arteriogram was performed.  With the use of a synchro-14 microwire, a regular renegade microcatheter was advanced into the distal aspect middle rectal artery to the origin of the extravasation/pseudoaneurysm.  A 5 mm diameter interlock coil was then deployed within the central aspect of the pseudoaneurysm.  Multiple overlapping 2 and 3 mm diameter interlock coils were then deployed along the distal length of the middle rectal artery.  The microcatheter was retracted into the more central aspect of the middle rectal artery and a completion arteriogram was performed.  At this point, the procedure was terminated.  All wires catheters and sheaths removed from the patient.  Hemostasis was achieved at the right groin access site with deployment of an Exoseal closure device.  Hemostasis was achieved with manual compression.  A dressing was placed.  Findings:  Left internal iliac arteriogram demonstrates a focal area of extravasation/pseudoaneurysm formation originating from the distal aspect of the anterior division of the left internal iliac artery, presumably the middle rectal artery.  A microcatheter was advanced to the origin of the extravasation/pseudoaneurysm.  Multiple coils were utilized to occlude the neck of the pseudoaneurysm as well as the distal aspect of the feeding extravasating vessel.  Completion arteriogram was negative for an additional area of discrete vessel irregularity or extravasation.  Impression:  Technically successful coil embolization of a focal area of active extravastaion/pseudoaneurysm arising from the distal aspect of a branch of the anterior division of the left internal iliac artery, presumably the middle rectal artery.   Original Report Authenticated By: Tacey Ruiz, MD   Ir Angiogram Selective Each Additional Vessel  01/23/2013   *RADIOLOGY REPORT*  Clinical Data: Post C-section hemorrhage, post hysterectomy with persistent abdominal and pelvic  bleeding.  Post open abdominal compartment decompression with packing at which time the patient was found to have a large left-sided the pelvic side wall hematoma. The patient is hemodynamically unstable, currently receiving blood products and on vasopressors.  The patient transferred emergently from the operating room for a pelvic arteriogram.  1.  ULTRASOUND GUIDANCE FOR ARTERIAL ACCESS 2.  LEFT INTERNAL ILIAC ARTERIOGRAM 3.  MIDDLE RECTAL ARTERIOGRAM (3rd ORDER) WITH PERCUTANEOUS COIL EMBOLIZATION  Comparison: None  Intravenous Medications: None - the patient was brought emergently from the operating room under general anesthesia  Contrast: 60 ml Omnipaque-300  Fluoroscopy Time: 7 minutes, 6 seconds  Access:  Right common femoral artery; hemostasis achieved with deployment of an Exoseal device.  Complications: None immediate  Technique:  Informed written consent was obtained from the the patient's husband via the use of a Medical interpreter after a discussion of the risks, benefits and alternatives to treatment.  Questions regarding the procedure were encouraged and answered.  A timeout was performed prior to the initiation of the procedure.  The right groin was prepped and draped in the usual sterile fashion, and a sterile drape was applied covering the operative field.  Maximum barrier sterile technique with sterile gowns and gloves were used for the procedure.  A timeout was performed prior to the initiation of the procedure.  Local anesthesia was provided with 1% lidocaine.  The right femoral head was marked  fluoroscopically.  Under ultrasound guidance, the right common femoral artery was accessed with a micropuncture kit after the overlying soft tissues were anesthetized with 1% lidocaine.  An ultrasound image was saved for documentation purposes.  The micropuncture sheath was exchanged for a 5 [place] vascular sheath over a Bentson wire.  A closure arteriogram was performed through the side of the sheath  confirming access within the right common femoral artery.  Over a Bentson wire, a C2 catheter was advanced to the pelvic bifurcation where it was back bled and flushed.  The C2 catheter was then utilized to select the contralateral left internal iliac artery and a left internal iliac arteriogram was performed.  With the use of a synchro-14 microwire, a regular renegade microcatheter was advanced into the distal aspect middle rectal artery to the origin of the extravasation/pseudoaneurysm.  A 5 mm diameter interlock coil was then deployed within the central aspect of the pseudoaneurysm.  Multiple overlapping 2 and 3 mm diameter interlock coils were then deployed along the distal length of the middle rectal artery.  The microcatheter was retracted into the more central aspect of the middle rectal artery and a completion arteriogram was performed.  At this point, the procedure was terminated.  All wires catheters and sheaths removed from the patient.  Hemostasis was achieved at the right groin access site with deployment of an Exoseal closure device.  Hemostasis was achieved with manual compression.  A dressing was placed.  Findings:  Left internal iliac arteriogram demonstrates a focal area of extravasation/pseudoaneurysm formation originating from the distal aspect of the anterior division of the left internal iliac artery, presumably the middle rectal artery.  A microcatheter was advanced to the origin of the extravasation/pseudoaneurysm.  Multiple coils were utilized to occlude the neck of the pseudoaneurysm as well as the distal aspect of the feeding extravasating vessel.  Completion arteriogram was negative for an additional area of discrete vessel irregularity or extravasation.  Impression:  Technically successful coil embolization of a focal area of active extravastaion/pseudoaneurysm arising from the distal aspect of a branch of the anterior division of the left internal iliac artery, presumably the middle  rectal artery.   Original Report Authenticated By: Tacey Ruiz, MD   Ir Angiogram Follow Up Study  01/23/2013   *RADIOLOGY REPORT*  Clinical Data: Post C-section hemorrhage, post hysterectomy with persistent abdominal and pelvic bleeding.  Post open abdominal compartment decompression with packing at which time the patient was found to have a large left-sided the pelvic side wall hematoma. The patient is hemodynamically unstable, currently receiving blood products and on vasopressors.  The patient transferred emergently from the operating room for a pelvic arteriogram.  1.  ULTRASOUND GUIDANCE FOR ARTERIAL ACCESS 2.  LEFT INTERNAL ILIAC ARTERIOGRAM 3.  MIDDLE RECTAL ARTERIOGRAM (3rd ORDER) WITH PERCUTANEOUS COIL EMBOLIZATION  Comparison: None  Intravenous Medications: None - the patient was brought emergently from the operating room under general anesthesia  Contrast: 60 ml Omnipaque-300  Fluoroscopy Time: 7 minutes, 6 seconds  Access:  Right common femoral artery; hemostasis achieved with deployment of an Exoseal device.  Complications: None immediate  Technique:  Informed written consent was obtained from the the patient's husband via the use of a Medical interpreter after a discussion of the risks, benefits and alternatives to treatment.  Questions regarding the procedure were encouraged and answered.  A timeout was performed prior to the initiation of the procedure.  The right groin was prepped and draped in the usual sterile fashion, and  a sterile drape was applied covering the operative field.  Maximum barrier sterile technique with sterile gowns and gloves were used for the procedure.  A timeout was performed prior to the initiation of the procedure.  Local anesthesia was provided with 1% lidocaine.  The right femoral head was marked fluoroscopically.  Under ultrasound guidance, the right common femoral artery was accessed with a micropuncture kit after the overlying soft tissues were anesthetized with 1%  lidocaine.  An ultrasound image was saved for documentation purposes.  The micropuncture sheath was exchanged for a 5 [place] vascular sheath over a Bentson wire.  A closure arteriogram was performed through the side of the sheath confirming access within the right common femoral artery.  Over a Bentson wire, a C2 catheter was advanced to the pelvic bifurcation where it was back bled and flushed.  The C2 catheter was then utilized to select the contralateral left internal iliac artery and a left internal iliac arteriogram was performed.  With the use of a synchro-14 microwire, a regular renegade microcatheter was advanced into the distal aspect middle rectal artery to the origin of the extravasation/pseudoaneurysm.  A 5 mm diameter interlock coil was then deployed within the central aspect of the pseudoaneurysm.  Multiple overlapping 2 and 3 mm diameter interlock coils were then deployed along the distal length of the middle rectal artery.  The microcatheter was retracted into the more central aspect of the middle rectal artery and a completion arteriogram was performed.  At this point, the procedure was terminated.  All wires catheters and sheaths removed from the patient.  Hemostasis was achieved at the right groin access site with deployment of an Exoseal closure device.  Hemostasis was achieved with manual compression.  A dressing was placed.  Findings:  Left internal iliac arteriogram demonstrates a focal area of extravasation/pseudoaneurysm formation originating from the distal aspect of the anterior division of the left internal iliac artery, presumably the middle rectal artery.  A microcatheter was advanced to the origin of the extravasation/pseudoaneurysm.  Multiple coils were utilized to occlude the neck of the pseudoaneurysm as well as the distal aspect of the feeding extravasating vessel.  Completion arteriogram was negative for an additional area of discrete vessel irregularity or extravasation.   Impression:  Technically successful coil embolization of a focal area of active extravastaion/pseudoaneurysm arising from the distal aspect of a branch of the anterior division of the left internal iliac artery, presumably the middle rectal artery.   Original Report Authenticated By: Tacey Ruiz, MD   Ir US Guide Vasc Access Right  01/23/2013   *RADIOLOGY REPORT*  Clinical Data: Post C-section hemorrhage, post hysterectomy with persistent abdominal and pelvic bleeding.  Post open abdominal compartment decompression with packing at which time the patient was found to have a large left-sided the pelvic side wall hematoma. The patient is hemodynamically unstable, currently receiving blood products and on vasopressors.  The patient transferred emergently from the operating room for a pelvic arteriogram.  1.  ULTRASOUND GUIDANCE FOR ARTERIAL ACCESS 2.  LEFT INTERNAL ILIAC ARTERIOGRAM 3.  MIDDLE RECTAL ARTERIOGRAM (3rd ORDER) WITH PERCUTANEOUS COIL EMBOLIZATION  Comparison: None  Intravenous Medications: None - the patient was brought emergently from the operating room under general anesthesia  Contrast: 60 ml Omnipaque-300  Fluoroscopy Time: 7 minutes, 6 seconds  Access:  Right common femoral artery; hemostasis achieved with deployment of an Exoseal device.  Complications: None immediate  Technique:  Informed written consent was obtained from the the patient's husband via  the use of a Medical interpreter after a discussion of the risks, benefits and alternatives to treatment.  Questions regarding the procedure were encouraged and answered.  A timeout was performed prior to the initiation of the procedure.  The right groin was prepped and draped in the usual sterile fashion, and a sterile drape was applied covering the operative field.  Maximum barrier sterile technique with sterile gowns and gloves were used for the procedure.  A timeout was performed prior to the initiation of the procedure.  Local anesthesia was  provided with 1% lidocaine.  The right femoral head was marked fluoroscopically.  Under ultrasound guidance, the right common femoral artery was accessed with a micropuncture kit after the overlying soft tissues were anesthetized with 1% lidocaine.  An ultrasound image was saved for documentation purposes.  The micropuncture sheath was exchanged for a 5 [place] vascular sheath over a Bentson wire.  A closure arteriogram was performed through the side of the sheath confirming access within the right common femoral artery.  Over a Bentson wire, a C2 catheter was advanced to the pelvic bifurcation where it was back bled and flushed.  The C2 catheter was then utilized to select the contralateral left internal iliac artery and a left internal iliac arteriogram was performed.  With the use of a synchro-14 microwire, a regular renegade microcatheter was advanced into the distal aspect middle rectal artery to the origin of the extravasation/pseudoaneurysm.  A 5 mm diameter interlock coil was then deployed within the central aspect of the pseudoaneurysm.  Multiple overlapping 2 and 3 mm diameter interlock coils were then deployed along the distal length of the middle rectal artery.  The microcatheter was retracted into the more central aspect of the middle rectal artery and a completion arteriogram was performed.  At this point, the procedure was terminated.  All wires catheters and sheaths removed from the patient.  Hemostasis was achieved at the right groin access site with deployment of an Exoseal closure device.  Hemostasis was achieved with manual compression.  A dressing was placed.  Findings:  Left internal iliac arteriogram demonstrates a focal area of extravasation/pseudoaneurysm formation originating from the distal aspect of the anterior division of the left internal iliac artery, presumably the middle rectal artery.  A microcatheter was advanced to the origin of the extravasation/pseudoaneurysm.  Multiple coils  were utilized to occlude the neck of the pseudoaneurysm as well as the distal aspect of the feeding extravasating vessel.  Completion arteriogram was negative for an additional area of discrete vessel irregularity or extravasation.  Impression:  Technically successful coil embolization of a focal area of active extravastaion/pseudoaneurysm arising from the distal aspect of a branch of the anterior division of the left internal iliac artery, presumably the middle rectal artery.   Original Report Authenticated By: Tacey Ruiz, MD   Portable Chest Xray  01/23/2013   *RADIOLOGY REPORT*  Clinical Data: Endotracheal tube placement  PORTABLE CHEST - 1 VIEW  Comparison: 01/22/2013  Findings:  Grossly unchanged cardiac silhouette and mediastinal contours. Interval intubation with endotracheal tube overlying the tracheal air column with tip approximately 3 cm above the carina.  Enteric tube tip and side port project below the left hemidiaphragm.  An esophageal pH probe projects over the thoracic inlet.  Stable positioning of left jugular approach central venous catheter with tip overlying the mid SVC.  No supine evidence of pneumothorax or pleural effusion.  Worsening left basilar/retrocardiac heterogeneous / consolidative opacities.  No definite evidence of edema.  Unchanged bones.  IMPRESSION: 1.  Appropriately positioned support apparatus as above.  No supine evidence of pneumothorax. 2.  Worsening left basilar/retrocardiac opacity, favored to represent atelectasis.   Original Report Authenticated By: Tacey Ruiz, MD   Dg Chest Port 1 View  01/22/2013   *RADIOLOGY REPORT*  Clinical Data: Central line placement  PORTABLE CHEST - 1 VIEW  Comparison: None.  Findings: Left IJ central line tip terminates over the mid SVC. Lung volumes are low with crowding of the bronchovascular markings. Patchy retrocardiac airspace opacity is noted.  Heart size is normal.  No pleural effusion. No pneumothorax.  IMPRESSION:  Appropriately positioned left IJ central line.  Patchy left greater than right airspace opacity, likely atelectasis.  Aspiration or early pneumonia at the left lung base could have a similar appearance.   Original Report Authenticated By: Christiana Pellant, M.D.   Ir [facility] [place] [facility] Guide Roadmapping  01/23/2013   *RADIOLOGY REPORT*  Clinical Data: Post C-section hemorrhage, post hysterectomy with persistent abdominal and pelvic bleeding.  Post open abdominal compartment decompression with packing at which time the patient was found to have a large left-sided the pelvic side wall hematoma. The patient is hemodynamically unstable, currently receiving blood products and on vasopressors.  The patient transferred emergently from the operating room for a pelvic arteriogram.  1.  ULTRASOUND GUIDANCE FOR ARTERIAL ACCESS 2.  LEFT INTERNAL ILIAC ARTERIOGRAM 3.  MIDDLE RECTAL ARTERIOGRAM (3rd ORDER) WITH PERCUTANEOUS COIL EMBOLIZATION  Comparison: None  Intravenous Medications: None - the patient was brought emergently from the operating room under general anesthesia  Contrast: 60 ml Omnipaque-300  Fluoroscopy Time: 7 minutes, 6 seconds  Access:  Right common femoral artery; hemostasis achieved with deployment of an Exoseal device.  Complications: None immediate  Technique:  Informed written consent was obtained from the the patient's husband via the use of a Medical interpreter after a discussion of the risks, benefits and alternatives to treatment.  Questions regarding the procedure were encouraged and answered.  A timeout was performed prior to the initiation of the procedure.  The right groin was prepped and draped in the usual sterile fashion, and a sterile drape was applied covering the operative field.  Maximum barrier sterile technique with sterile gowns and gloves were used for the procedure.  A timeout was performed prior to the initiation of the procedure.  Local anesthesia was provided with  1% lidocaine.  The right femoral head was marked fluoroscopically.  Under ultrasound guidance, the right common femoral artery was accessed with a micropuncture kit after the overlying soft tissues were anesthetized with 1% lidocaine.  An ultrasound image was saved for documentation purposes.  The micropuncture sheath was exchanged for a 5 [place] vascular sheath over a Bentson wire.  A closure arteriogram was performed through the side of the sheath confirming access within the right common femoral artery.  Over a Bentson wire, a C2 catheter was advanced to the pelvic bifurcation where it was back bled and flushed.  The C2 catheter was then utilized to select the contralateral left internal iliac artery and a left internal iliac arteriogram was performed.  With the use of a synchro-14 microwire, a regular renegade microcatheter was advanced into the distal aspect middle rectal artery to the origin of the extravasation/pseudoaneurysm.  A 5 mm diameter interlock coil was then deployed within the central aspect of the pseudoaneurysm.  Multiple overlapping 2 and 3 mm diameter interlock coils were then deployed along the  distal length of the middle rectal artery.  The microcatheter was retracted into the more central aspect of the middle rectal artery and a completion arteriogram was performed.  At this point, the procedure was terminated.  All wires catheters and sheaths removed from the patient.  Hemostasis was achieved at the right groin access site with deployment of an Exoseal closure device.  Hemostasis was achieved with manual compression.  A dressing was placed.  Findings:  Left internal iliac arteriogram demonstrates a focal area of extravasation/pseudoaneurysm formation originating from the distal aspect of the anterior division of the left internal iliac artery, presumably the middle rectal artery.  A microcatheter was advanced to the origin of the extravasation/pseudoaneurysm.  Multiple coils were utilized to  occlude the neck of the pseudoaneurysm as well as the distal aspect of the feeding extravasating vessel.  Completion arteriogram was negative for an additional area of discrete vessel irregularity or extravasation.  Impression:  Technically successful coil embolization of a focal area of active extravastaion/pseudoaneurysm arising from the distal aspect of a branch of the anterior division of the left internal iliac artery, presumably the middle rectal artery.   Original Report Authenticated By: Tacey Ruiz, MD    Anti-infectives: Anti-infectives   Start     Dose/Rate Route Frequency Ordered Stop   01/23/13 1100  piperacillin-tazobactam (ZOSYN) IVPB 3.375 g     3.375 g 12.5 mL/hr over 240 Minutes Intravenous Every 8 hours 01/23/13 1031     01/22/13 1800  piperacillin-tazobactam (ZOSYN) IVPB 3.375 g  Status:  Discontinued     3.375 g 12.5 mL/hr over 240 Minutes Intravenous Every 8 hours 01/22/13 1709 01/23/13 1031   01/22/13 1200  gentamicin (GARAMYCIN) 140 mg in dextrose 5 % 50 mL IVPB  Status:  Discontinued     140 mg 107 mL/hr over 30 Minutes Intravenous Every 8 hours 01/22/13 0346 01/22/13 1709   01/22/13 0415  gentamicin (GARAMYCIN) 150 mg in dextrose 5 % 50 mL IVPB     150 mg 107.5 mL/hr over 30 Minutes Intravenous  Once 01/22/13 0343 01/22/13 0440   01/22/13 0345  ampicillin (OMNIPEN) 2 g in sodium chloride 0.9 % 50 mL IVPB  Status:  Discontinued     2 g 150 mL/hr over 20 Minutes Intravenous Every 6 hours 01/22/13 0325 01/22/13 1709      Assessment/Plan: s/p Procedure(s): EXPLORATORY LAPAROTOMY (N/A) Continue foley due to patient critically ill and patient in ICU Con't vac Hct stable this AM Pt will require OR in AM for Vac& sponge removal with Dr. Dwain Sarna, Will have husband sign consent.    LOS: 3 days    Marigene Ehlers., [facility] 01/24/2013

## 2013-01-24 NOTE — Progress Notes (Signed)
Called to remove LEA catheter.  Discussed with Dr. Delford Field, coag status normal, platelet count 91k with normal INR.  Not on VTE prophylaxis.  LEA catheter removed, tip intact.  Site clean, dry, no drainage. Patient is able to move both legs. Patient is comfortable, VSS although remains intubated.    Sandford Craze, MD

## 2013-01-24 NOTE — Progress Notes (Signed)
1 Day Post-Op  Subjective: Iliac artery embolization 5/24 Pt stable Awake; alert; restful  Objective: Vital signs in last 24 hours: Temp:  [99 F (37.2 C)-102.4 F (39.1 C)] 100.4 F (38 C) (05/25 0735) Pulse Rate:  [108-139] 118 (05/25 0700) Resp:  [19-33] 23 (05/25 0700) BP: (94-129)/(64-84) 108/67 mmHg (05/25 0600) SpO2:  [99 %-100 %] 99 % (05/25 0700) Arterial Line BP: (101-138)/(69-97) 119/76 mmHg (05/25 0700) FiO2 (%):  [40 %-60 %] 40 % (05/25 0820) Weight:  [170 lb 13.7 oz (77.5 kg)] 170 lb 13.7 oz (77.5 kg) (05/25 0500)    Intake/Output from previous day: 05/24 0701 - 05/25 0700 In: 6491 [I.V.:2003; Blood:3308; NG/GT:30; IV Piggyback:1150] Out: 5160 [Urine:1310; Emesis/NG output:250; Drains:3000; Blood:600] Intake/Output this shift:    PE:  afeb VSS H/H stable Rt groin sl tender; no hematoma; no bleeding Rt foot 2+ pulses Large abd wound- wound vac intact  Lab Results:   Recent Labs  01/24/13 0030 01/24/13 0435  WBC 14.8* 15.7*  HGB 9.4* 9.0*  HCT 26.0* 24.9*  PLT 102* 91*   BMET  Recent Labs  01/23/13 2030 01/24/13 0435  NA 131* 134*  K 4.5 4.5  CL 101 105  CO2 23 20  GLUCOSE 122* 100*  BUN 17 20  CREATININE 0.66 0.77  CALCIUM 7.0* 6.8*   PT/INR  Recent Labs  01/23/13 0952 01/23/13 2030  LABPROT 16.2*  16.0* 13.7  INR 1.33  1.31 1.06   ABG  Recent Labs  01/23/13 1039 01/23/13 2029  PHART 7.429 7.385  HCO3 23.8 23.9    Studies/Results: Ir Angiogram Pelvis Selective Or Supraselective  01/23/2013   *RADIOLOGY REPORT*  Clinical Data: Post C-section hemorrhage, post hysterectomy with persistent abdominal and pelvic bleeding.  Post open abdominal compartment decompression with packing at which time the patient was found to have a large left-sided the pelvic side wall hematoma. The patient is hemodynamically unstable, currently receiving blood products and on vasopressors.  The patient transferred emergently from the operating room  for a pelvic arteriogram.  1.  ULTRASOUND GUIDANCE FOR ARTERIAL ACCESS 2.  LEFT INTERNAL ILIAC ARTERIOGRAM 3.  MIDDLE RECTAL ARTERIOGRAM (3rd ORDER) WITH PERCUTANEOUS COIL EMBOLIZATION  Comparison: None  Intravenous Medications: None - the patient was brought emergently from the operating room under general anesthesia  Contrast: 60 ml Omnipaque-300  Fluoroscopy Time: 7 minutes, 6 seconds  Access:  Right common femoral artery; hemostasis achieved with deployment of an Exoseal device.  Complications: None immediate  Technique:  Informed written consent was obtained from the the patient's husband via the use of a Medical interpreter after a discussion of the risks, benefits and alternatives to treatment.  Questions regarding the procedure were encouraged and answered.  A timeout was performed prior to the initiation of the procedure.  The right groin was prepped and draped in the usual sterile fashion, and a sterile drape was applied covering the operative field.  Maximum barrier sterile technique with sterile gowns and gloves were used for the procedure.  A timeout was performed prior to the initiation of the procedure.  Local anesthesia was provided with 1% lidocaine.  The right femoral head was marked fluoroscopically.  Under ultrasound guidance, the right common femoral artery was accessed with a micropuncture kit after the overlying soft tissues were anesthetized with 1% lidocaine.  An ultrasound image was saved for documentation purposes.  The micropuncture sheath was exchanged for a 5 [place] vascular sheath over a Bentson wire.  A closure arteriogram was performed through  the side of the sheath confirming access within the right common femoral artery.  Over a Bentson wire, a C2 catheter was advanced to the pelvic bifurcation where it was back bled and flushed.  The C2 catheter was then utilized to select the contralateral left internal iliac artery and a left internal iliac arteriogram was performed.  With the  use of a synchro-14 microwire, a regular renegade microcatheter was advanced into the distal aspect middle rectal artery to the origin of the extravasation/pseudoaneurysm.  A 5 mm diameter interlock coil was then deployed within the central aspect of the pseudoaneurysm.  Multiple overlapping 2 and 3 mm diameter interlock coils were then deployed along the distal length of the middle rectal artery.  The microcatheter was retracted into the more central aspect of the middle rectal artery and a completion arteriogram was performed.  At this point, the procedure was terminated.  All wires catheters and sheaths removed from the patient.  Hemostasis was achieved at the right groin access site with deployment of an Exoseal closure device.  Hemostasis was achieved with manual compression.  A dressing was placed.  Findings:  Left internal iliac arteriogram demonstrates a focal area of extravasation/pseudoaneurysm formation originating from the distal aspect of the anterior division of the left internal iliac artery, presumably the middle rectal artery.  A microcatheter was advanced to the origin of the extravasation/pseudoaneurysm.  Multiple coils were utilized to occlude the neck of the pseudoaneurysm as well as the distal aspect of the feeding extravasating vessel.  Completion arteriogram was negative for an additional area of discrete vessel irregularity or extravasation.  Impression:  Technically successful coil embolization of a focal area of active extravastaion/pseudoaneurysm arising from the distal aspect of a branch of the anterior division of the left internal iliac artery, presumably the middle rectal artery.   Original Report Authenticated By: Tacey Ruiz, MD   Ir Angiogram Selective Each Additional Vessel  01/23/2013   *RADIOLOGY REPORT*  Clinical Data: Post C-section hemorrhage, post hysterectomy with persistent abdominal and pelvic bleeding.  Post open abdominal compartment decompression with packing at  which time the patient was found to have a large left-sided the pelvic side wall hematoma. The patient is hemodynamically unstable, currently receiving blood products and on vasopressors.  The patient transferred emergently from the operating room for a pelvic arteriogram.  1.  ULTRASOUND GUIDANCE FOR ARTERIAL ACCESS 2.  LEFT INTERNAL ILIAC ARTERIOGRAM 3.  MIDDLE RECTAL ARTERIOGRAM (3rd ORDER) WITH PERCUTANEOUS COIL EMBOLIZATION  Comparison: None  Intravenous Medications: None - the patient was brought emergently from the operating room under general anesthesia  Contrast: 60 ml Omnipaque-300  Fluoroscopy Time: 7 minutes, 6 seconds  Access:  Right common femoral artery; hemostasis achieved with deployment of an Exoseal device.  Complications: None immediate  Technique:  Informed written consent was obtained from the the patient's husband via the use of a Medical interpreter after a discussion of the risks, benefits and alternatives to treatment.  Questions regarding the procedure were encouraged and answered.  A timeout was performed prior to the initiation of the procedure.  The right groin was prepped and draped in the usual sterile fashion, and a sterile drape was applied covering the operative field.  Maximum barrier sterile technique with sterile gowns and gloves were used for the procedure.  A timeout was performed prior to the initiation of the procedure.  Local anesthesia was provided with 1% lidocaine.  The right femoral head was marked fluoroscopically.  Under ultrasound guidance, the  right common femoral artery was accessed with a micropuncture kit after the overlying soft tissues were anesthetized with 1% lidocaine.  An ultrasound image was saved for documentation purposes.  The micropuncture sheath was exchanged for a 5 [place] vascular sheath over a Bentson wire.  A closure arteriogram was performed through the side of the sheath confirming access within the right common femoral artery.  Over a Bentson  wire, a C2 catheter was advanced to the pelvic bifurcation where it was back bled and flushed.  The C2 catheter was then utilized to select the contralateral left internal iliac artery and a left internal iliac arteriogram was performed.  With the use of a synchro-14 microwire, a regular renegade microcatheter was advanced into the distal aspect middle rectal artery to the origin of the extravasation/pseudoaneurysm.  A 5 mm diameter interlock coil was then deployed within the central aspect of the pseudoaneurysm.  Multiple overlapping 2 and 3 mm diameter interlock coils were then deployed along the distal length of the middle rectal artery.  The microcatheter was retracted into the more central aspect of the middle rectal artery and a completion arteriogram was performed.  At this point, the procedure was terminated.  All wires catheters and sheaths removed from the patient.  Hemostasis was achieved at the right groin access site with deployment of an Exoseal closure device.  Hemostasis was achieved with manual compression.  A dressing was placed.  Findings:  Left internal iliac arteriogram demonstrates a focal area of extravasation/pseudoaneurysm formation originating from the distal aspect of the anterior division of the left internal iliac artery, presumably the middle rectal artery.  A microcatheter was advanced to the origin of the extravasation/pseudoaneurysm.  Multiple coils were utilized to occlude the neck of the pseudoaneurysm as well as the distal aspect of the feeding extravasating vessel.  Completion arteriogram was negative for an additional area of discrete vessel irregularity or extravasation.  Impression:  Technically successful coil embolization of a focal area of active extravastaion/pseudoaneurysm arising from the distal aspect of a branch of the anterior division of the left internal iliac artery, presumably the middle rectal artery.   Original Report Authenticated By: Tacey Ruiz, MD   Ir  Angiogram Follow Up Study  01/23/2013   *RADIOLOGY REPORT*  Clinical Data: Post C-section hemorrhage, post hysterectomy with persistent abdominal and pelvic bleeding.  Post open abdominal compartment decompression with packing at which time the patient was found to have a large left-sided the pelvic side wall hematoma. The patient is hemodynamically unstable, currently receiving blood products and on vasopressors.  The patient transferred emergently from the operating room for a pelvic arteriogram.  1.  ULTRASOUND GUIDANCE FOR ARTERIAL ACCESS 2.  LEFT INTERNAL ILIAC ARTERIOGRAM 3.  MIDDLE RECTAL ARTERIOGRAM (3rd ORDER) WITH PERCUTANEOUS COIL EMBOLIZATION  Comparison: None  Intravenous Medications: None - the patient was brought emergently from the operating room under general anesthesia  Contrast: 60 ml Omnipaque-300  Fluoroscopy Time: 7 minutes, 6 seconds  Access:  Right common femoral artery; hemostasis achieved with deployment of an Exoseal device.  Complications: None immediate  Technique:  Informed written consent was obtained from the the patient's husband via the use of a Medical interpreter after a discussion of the risks, benefits and alternatives to treatment.  Questions regarding the procedure were encouraged and answered.  A timeout was performed prior to the initiation of the procedure.  The right groin was prepped and draped in the usual sterile fashion, and a sterile drape was applied covering  the operative field.  Maximum barrier sterile technique with sterile gowns and gloves were used for the procedure.  A timeout was performed prior to the initiation of the procedure.  Local anesthesia was provided with 1% lidocaine.  The right femoral head was marked fluoroscopically.  Under ultrasound guidance, the right common femoral artery was accessed with a micropuncture kit after the overlying soft tissues were anesthetized with 1% lidocaine.  An ultrasound image was saved for documentation purposes.  The  micropuncture sheath was exchanged for a 5 [place] vascular sheath over a Bentson wire.  A closure arteriogram was performed through the side of the sheath confirming access within the right common femoral artery.  Over a Bentson wire, a C2 catheter was advanced to the pelvic bifurcation where it was back bled and flushed.  The C2 catheter was then utilized to select the contralateral left internal iliac artery and a left internal iliac arteriogram was performed.  With the use of a synchro-14 microwire, a regular renegade microcatheter was advanced into the distal aspect middle rectal artery to the origin of the extravasation/pseudoaneurysm.  A 5 mm diameter interlock coil was then deployed within the central aspect of the pseudoaneurysm.  Multiple overlapping 2 and 3 mm diameter interlock coils were then deployed along the distal length of the middle rectal artery.  The microcatheter was retracted into the more central aspect of the middle rectal artery and a completion arteriogram was performed.  At this point, the procedure was terminated.  All wires catheters and sheaths removed from the patient.  Hemostasis was achieved at the right groin access site with deployment of an Exoseal closure device.  Hemostasis was achieved with manual compression.  A dressing was placed.  Findings:  Left internal iliac arteriogram demonstrates a focal area of extravasation/pseudoaneurysm formation originating from the distal aspect of the anterior division of the left internal iliac artery, presumably the middle rectal artery.  A microcatheter was advanced to the origin of the extravasation/pseudoaneurysm.  Multiple coils were utilized to occlude the neck of the pseudoaneurysm as well as the distal aspect of the feeding extravasating vessel.  Completion arteriogram was negative for an additional area of discrete vessel irregularity or extravasation.  Impression:  Technically successful coil embolization of a focal area of active  extravastaion/pseudoaneurysm arising from the distal aspect of a branch of the anterior division of the left internal iliac artery, presumably the middle rectal artery.   Original Report Authenticated By: Tacey Ruiz, MD   Ir US Guide Vasc Access Right  01/23/2013   *RADIOLOGY REPORT*  Clinical Data: Post C-section hemorrhage, post hysterectomy with persistent abdominal and pelvic bleeding.  Post open abdominal compartment decompression with packing at which time the patient was found to have a large left-sided the pelvic side wall hematoma. The patient is hemodynamically unstable, currently receiving blood products and on vasopressors.  The patient transferred emergently from the operating room for a pelvic arteriogram.  1.  ULTRASOUND GUIDANCE FOR ARTERIAL ACCESS 2.  LEFT INTERNAL ILIAC ARTERIOGRAM 3.  MIDDLE RECTAL ARTERIOGRAM (3rd ORDER) WITH PERCUTANEOUS COIL EMBOLIZATION  Comparison: None  Intravenous Medications: None - the patient was brought emergently from the operating room under general anesthesia  Contrast: 60 ml Omnipaque-300  Fluoroscopy Time: 7 minutes, 6 seconds  Access:  Right common femoral artery; hemostasis achieved with deployment of an Exoseal device.  Complications: None immediate  Technique:  Informed written consent was obtained from the the patient's husband via the use of a Medical interpreter  after a discussion of the risks, benefits and alternatives to treatment.  Questions regarding the procedure were encouraged and answered.  A timeout was performed prior to the initiation of the procedure.  The right groin was prepped and draped in the usual sterile fashion, and a sterile drape was applied covering the operative field.  Maximum barrier sterile technique with sterile gowns and gloves were used for the procedure.  A timeout was performed prior to the initiation of the procedure.  Local anesthesia was provided with 1% lidocaine.  The right femoral head was marked fluoroscopically.   Under ultrasound guidance, the right common femoral artery was accessed with a micropuncture kit after the overlying soft tissues were anesthetized with 1% lidocaine.  An ultrasound image was saved for documentation purposes.  The micropuncture sheath was exchanged for a 5 [place] vascular sheath over a Bentson wire.  A closure arteriogram was performed through the side of the sheath confirming access within the right common femoral artery.  Over a Bentson wire, a C2 catheter was advanced to the pelvic bifurcation where it was back bled and flushed.  The C2 catheter was then utilized to select the contralateral left internal iliac artery and a left internal iliac arteriogram was performed.  With the use of a synchro-14 microwire, a regular renegade microcatheter was advanced into the distal aspect middle rectal artery to the origin of the extravasation/pseudoaneurysm.  A 5 mm diameter interlock coil was then deployed within the central aspect of the pseudoaneurysm.  Multiple overlapping 2 and 3 mm diameter interlock coils were then deployed along the distal length of the middle rectal artery.  The microcatheter was retracted into the more central aspect of the middle rectal artery and a completion arteriogram was performed.  At this point, the procedure was terminated.  All wires catheters and sheaths removed from the patient.  Hemostasis was achieved at the right groin access site with deployment of an Exoseal closure device.  Hemostasis was achieved with manual compression.  A dressing was placed.  Findings:  Left internal iliac arteriogram demonstrates a focal area of extravasation/pseudoaneurysm formation originating from the distal aspect of the anterior division of the left internal iliac artery, presumably the middle rectal artery.  A microcatheter was advanced to the origin of the extravasation/pseudoaneurysm.  Multiple coils were utilized to occlude the neck of the pseudoaneurysm as well as the distal aspect  of the feeding extravasating vessel.  Completion arteriogram was negative for an additional area of discrete vessel irregularity or extravasation.  Impression:  Technically successful coil embolization of a focal area of active extravastaion/pseudoaneurysm arising from the distal aspect of a branch of the anterior division of the left internal iliac artery, presumably the middle rectal artery.   Original Report Authenticated By: Tacey Ruiz, MD   Portable Chest Xray  01/23/2013   *RADIOLOGY REPORT*  Clinical Data: Endotracheal tube placement  PORTABLE CHEST - 1 VIEW  Comparison: 01/22/2013  Findings:  Grossly unchanged cardiac silhouette and mediastinal contours. Interval intubation with endotracheal tube overlying the tracheal air column with tip approximately 3 cm above the carina.  Enteric tube tip and side port project below the left hemidiaphragm.  An esophageal pH probe projects over the thoracic inlet.  Stable positioning of left jugular approach central venous catheter with tip overlying the mid SVC.  No supine evidence of pneumothorax or pleural effusion.  Worsening left basilar/retrocardiac heterogeneous / consolidative opacities.  No definite evidence of edema.  Unchanged bones.  IMPRESSION: 1.  Appropriately positioned support apparatus as above.  No supine evidence of pneumothorax. 2.  Worsening left basilar/retrocardiac opacity, favored to represent atelectasis.   Original Report Authenticated By: Tacey Ruiz, MD   Dg Chest Port 1 View  01/22/2013   *RADIOLOGY REPORT*  Clinical Data: Central line placement  PORTABLE CHEST - 1 VIEW  Comparison: None.  Findings: Left IJ central line tip terminates over the mid SVC. Lung volumes are low with crowding of the bronchovascular markings. Patchy retrocardiac airspace opacity is noted.  Heart size is normal.  No pleural effusion. No pneumothorax.  IMPRESSION: Appropriately positioned left IJ central line.  Patchy left greater than right airspace opacity,  likely atelectasis.  Aspiration or early pneumonia at the left lung base could have a similar appearance.   Original Report Authenticated By: Christiana Pellant, M.D.   Ir [facility] [employer] Guide Roadmapping  01/23/2013   *RADIOLOGY REPORT*  Clinical Data: Post C-section hemorrhage, post hysterectomy with persistent abdominal and pelvic bleeding.  Post open abdominal compartment decompression with packing at which time the patient was found to have a large left-sided the pelvic side wall hematoma. The patient is hemodynamically unstable, currently receiving blood products and on vasopressors.  The patient transferred emergently from the operating room for a pelvic arteriogram.  1.  ULTRASOUND GUIDANCE FOR ARTERIAL ACCESS 2.  LEFT INTERNAL ILIAC ARTERIOGRAM 3.  MIDDLE RECTAL ARTERIOGRAM (3rd ORDER) WITH PERCUTANEOUS COIL EMBOLIZATION  Comparison: None  Intravenous Medications: None - the patient was brought emergently from the operating room under general anesthesia  Contrast: 60 ml Omnipaque-300  Fluoroscopy Time: 7 minutes, 6 seconds  Access:  Right common femoral artery; hemostasis achieved with deployment of an Exoseal device.  Complications: None immediate  Technique:  Informed written consent was obtained from the the patient's husband via the use of a Medical interpreter after a discussion of the risks, benefits and alternatives to treatment.  Questions regarding the procedure were encouraged and answered.  A timeout was performed prior to the initiation of the procedure.  The right groin was prepped and draped in the usual sterile fashion, and a sterile drape was applied covering the operative field.  Maximum barrier sterile technique with sterile gowns and gloves were used for the procedure.  A timeout was performed prior to the initiation of the procedure.  Local anesthesia was provided with 1% lidocaine.  The right femoral head was marked fluoroscopically.  Under ultrasound guidance,  the right common femoral artery was accessed with a micropuncture kit after the overlying soft tissues were anesthetized with 1% lidocaine.  An ultrasound image was saved for documentation purposes.  The micropuncture sheath was exchanged for a 5 [place] vascular sheath over a Bentson wire.  A closure arteriogram was performed through the side of the sheath confirming access within the right common femoral artery.  Over a Bentson wire, a C2 catheter was advanced to the pelvic bifurcation where it was back bled and flushed.  The C2 catheter was then utilized to select the contralateral left internal iliac artery and a left internal iliac arteriogram was performed.  With the use of a synchro-14 microwire, a regular renegade microcatheter was advanced into the distal aspect middle rectal artery to the origin of the extravasation/pseudoaneurysm.  A 5 mm diameter interlock coil was then deployed within the central aspect of the pseudoaneurysm.  Multiple overlapping 2 and 3 mm diameter interlock coils were then deployed along the distal length of the middle rectal  artery.  The microcatheter was retracted into the more central aspect of the middle rectal artery and a completion arteriogram was performed.  At this point, the procedure was terminated.  All wires catheters and sheaths removed from the patient.  Hemostasis was achieved at the right groin access site with deployment of an Exoseal closure device.  Hemostasis was achieved with manual compression.  A dressing was placed.  Findings:  Left internal iliac arteriogram demonstrates a focal area of extravasation/pseudoaneurysm formation originating from the distal aspect of the anterior division of the left internal iliac artery, presumably the middle rectal artery.  A microcatheter was advanced to the origin of the extravasation/pseudoaneurysm.  Multiple coils were utilized to occlude the neck of the pseudoaneurysm as well as the distal aspect of the feeding  extravasating vessel.  Completion arteriogram was negative for an additional area of discrete vessel irregularity or extravasation.  Impression:  Technically successful coil embolization of a focal area of active extravastaion/pseudoaneurysm arising from the distal aspect of a branch of the anterior division of the left internal iliac artery, presumably the middle rectal artery.   Original Report Authenticated By: Tacey Ruiz, MD    Anti-infectives:   Assessment/Plan: s/p Procedure(s): EXPLORATORY LAPAROTOMY (N/A)  Left iliac artery embolization Pt doing well Will follow   LOS: 3 days    Shaquella Stamant A 01/24/2013

## 2013-01-24 NOTE — Progress Notes (Signed)
Tracheal aspirate respiratory culture collected vie ETT.  Specimen sent to lab.

## 2013-01-24 NOTE — Progress Notes (Signed)
1 Day Post-Op Procedure(s) (LRB): EXPLORATORY LAPAROTOMY (N/A) POD@2  s/p cesarean section with hysterectomy Subjective: Patient was found intubated but alert. Does not appear to be in pain.    Objective: I have reviewed patient's vital signs, intake and output, medications and labs.  General: alert, no distress and intubated Resp: rales base - bilaterally and bilaterally Cardio: S1, S2 normal and tachycardic GI: soft, appropriately tender, abdominal vac in place and intact Extremities: bilateral pedal pulses and bilateral lower extremity edema  Assessment: s/p Procedure(s): EXPLORATORY LAPAROTOMY (N/A):   Plan: Patient hemodynamic status significantly improved Abdominal drain with significant output but hemoglobin stable at 9 Surgical team planning re-exploration on Monday Continue current care per SICU  LOS: 3 days    Mackenzie [name] 01/24/2013, 7:19 AM

## 2013-01-24 NOTE — Anesthesia Postprocedure Evaluation (Signed)
Anesthesia Post Note  Patient: Mackenzie [name]  Procedure(s) Performed: Procedure(s) (LRB): SALPINGO OOPHORECTOMY (N/A)  Anesthesia type: General  Patient location: PACU  Post pain: Pain level controlled  Post assessment: Post-op Vital signs reviewed  Last Vitals: BP 108/67  Pulse 118  Temp(Src) 38 C (Oral)  Resp 23  Ht 5\' 1"  (1.549 m)  Wt 170 lb 13.7 oz (77.5 kg)  BMI 32.3 kg/m2  SpO2 99%  LMP 03/22/2012  Post vital signs: Reviewed  Level of consciousness: sedated  Complications: Pt transferred to [facility] ICU. Pt remains intubated after OR at [facility]. No apparent anesthesia complications.

## 2013-01-25 ENCOUNTER — Encounter (HOSPITAL_COMMUNITY): Payer: Self-pay | Admitting: Anesthesiology

## 2013-01-25 ENCOUNTER — Inpatient Hospital Stay (HOSPITAL_COMMUNITY): Payer: Medicaid Other

## 2013-01-25 ENCOUNTER — Encounter (HOSPITAL_COMMUNITY)
Admission: RE | Disposition: A | Discharge status: Home / Self Care | Payer: Self-pay | Source: Ambulatory Visit | Attending: Critical Care Medicine

## 2013-01-25 ENCOUNTER — Inpatient Hospital Stay (HOSPITAL_COMMUNITY): Payer: Medicaid Other | Admitting: Anesthesiology

## 2013-01-25 DIAGNOSIS — J95821 Acute postprocedural respiratory failure: Secondary | ICD-10-CM

## 2013-01-25 HISTORY — PX: VACUUM ASSISTED CLOSURE CHANGE: SHX5227

## 2013-01-25 HISTORY — PX: LAPAROTOMY: SHX154

## 2013-01-25 LAB — BASIC METABOLIC PANEL
BUN: 17 mg/dL (ref 6–23)
BUN: 17 mg/dL (ref 6–23)
CO2: 21 mEq/L (ref 19–32)
CO2: 22 mEq/L (ref 19–32)
Calcium: 7.3 mg/dL — ABNORMAL LOW (ref 8.4–10.5)
Chloride: 105 mEq/L (ref 96–112)
Creatinine, Ser: 0.61 mg/dL (ref 0.50–1.10)
Creatinine, Ser: 0.66 mg/dL (ref 0.50–1.10)
GFR calc Af Amer: >90 mL/min (ref >90)
GFR calc Af Amer: >90 mL/min (ref >90)
GFR calc non Af Amer: >90 mL/min (ref >90)
Potassium: 4.5 mEq/L (ref 3.5–5.1)

## 2013-01-25 LAB — CBC WITH DIFFERENTIAL/PLATELET
Basophils Absolute: 0 10*3/uL (ref 0.0–0.1)
Basophils Relative: 0 % (ref 0–1)
Eosinophils Absolute: 0 10*3/uL (ref 0.0–0.7)
Eosinophils Absolute: 0 10*3/uL (ref 0.0–0.7)
Hemoglobin: 8.6 g/dL — ABNORMAL LOW (ref 12.0–15.0)
Lymphocytes Relative: 9 % — ABNORMAL LOW (ref 12–46)
MCH: 29.9 pg (ref 26.0–34.0)
MCHC: 34.8 g/dL (ref 30.0–36.0)
MCHC: 34.2 g/dL (ref 30.0–36.0)
Monocytes Absolute: 2.6 10*3/uL — ABNORMAL HIGH (ref 0.1–1.0)
Neutrophils Relative %: 80 % — ABNORMAL HIGH (ref 43–77)
Neutrophils Relative %: 83 % — ABNORMAL HIGH (ref 43–77)
Platelets: 159 10*3/uL (ref 150–400)
RDW: 15.3 % (ref 11.5–15.5)
Smear Review: ADEQUATE
WBC Morphology: INCREASED
WBC Morphology: INCREASED
WBC: 22.7 10*3/uL — ABNORMAL HIGH (ref 4.0–10.5)

## 2013-01-25 LAB — GLUCOSE, CAPILLARY
Glucose-Capillary: 78 mg/dL (ref 70–99)
Glucose-Capillary: 82 mg/dL (ref 70–99)
Glucose-Capillary: 88 mg/dL (ref 70–99)
Glucose-Capillary: 92 mg/dL (ref 70–99)

## 2013-01-25 SURGERY — LAPAROTOMY, EXPLORATORY
Anesthesia: General | Site: Abdomen | Wound class: Dirty or Infected

## 2013-01-25 MED ORDER — WHITE PETROLATUM GEL
Status: AC
  Administered 2013-01-25: 0.2
  Filled 2013-01-25: qty 5

## 2013-01-25 MED ORDER — FENTANYL CITRATE 0.05 MG/ML IJ SOLN
INTRAMUSCULAR | Status: DC | PRN
  Administered 2013-01-25 (×2): 100 ug via INTRAVENOUS
  Administered 2013-01-25: 50 ug via INTRAVENOUS

## 2013-01-25 MED ORDER — MIDAZOLAM HCL 5 MG/5ML IJ SOLN
INTRAMUSCULAR | Status: DC | PRN
  Administered 2013-01-25: 2 mg via INTRAVENOUS

## 2013-01-25 MED ORDER — ESMOLOL HCL 10 MG/ML IV SOLN
INTRAVENOUS | Status: DC | PRN
  Administered 2013-01-25: 30 mg via INTRAVENOUS

## 2013-01-25 MED ORDER — ROCURONIUM BROMIDE 100 MG/10ML IV SOLN
INTRAVENOUS | Status: DC | PRN
  Administered 2013-01-25 (×2): 10 mg via INTRAVENOUS
  Administered 2013-01-25: 30 mg via INTRAVENOUS
  Administered 2013-01-25: 10 mg via INTRAVENOUS

## 2013-01-25 SURGICAL SUPPLY — 42 items
BLADE SURG ROTATE 9660 (MISCELLANEOUS) IMPLANT
CANISTER SUCTION 2500CC (MISCELLANEOUS) IMPLANT
CANISTER WOUND CARE 500ML ATS (WOUND CARE) ×2 IMPLANT
CHLORAPREP W/TINT 26ML (MISCELLANEOUS) IMPLANT
CLOTH BEACON ORANGE TIMEOUT ST (SAFETY) ×2 IMPLANT
COVER SURGICAL LIGHT HANDLE (MISCELLANEOUS) ×2 IMPLANT
DRAPE LAPAROSCOPIC ABDOMINAL (DRAPES) ×2 IMPLANT
DRAPE UTILITY 15X26 W/TAPE STR (DRAPE) ×4 IMPLANT
DRAPE WARM FLUID 44X44 (DRAPE) IMPLANT
DRSG VAC ATS LRG SENSATRAC (GAUZE/BANDAGES/DRESSINGS) ×2 IMPLANT
ELECT BLADE 6.5 EXT (BLADE) IMPLANT
ELECT CAUTERY BLADE 6.4 (BLADE) IMPLANT
ELECT REM PT RETURN 9FT ADLT (ELECTROSURGICAL) ×2
ELECTRODE REM PT RTRN 9FT ADLT (ELECTROSURGICAL) ×1 IMPLANT
GLOVE BIO SURGEON STRL SZ7 (GLOVE) ×2 IMPLANT
GLOVE BIOGEL PI IND STRL 7.5 (GLOVE) ×1 IMPLANT
GLOVE BIOGEL PI INDICATOR 7.5 (GLOVE) ×1
GOWN STRL NON-REIN LRG LVL3 (GOWN DISPOSABLE) ×6 IMPLANT
KIT BASIN OR (CUSTOM PROCEDURE TRAY) ×2 IMPLANT
KIT ROOM TURNOVER OR (KITS) ×2 IMPLANT
LIGASURE IMPACT 36 18CM CVD LR (INSTRUMENTS) IMPLANT
NS IRRIG 1000ML POUR BTL (IV SOLUTION) ×20 IMPLANT
PACK GENERAL/GYN (CUSTOM PROCEDURE TRAY) ×2 IMPLANT
PAD ARMBOARD 7.5X6 YLW CONV (MISCELLANEOUS) ×2 IMPLANT
SPECIMEN JAR LARGE (MISCELLANEOUS) IMPLANT
SPONGE ABDOMINAL VAC ABTHERA (MISCELLANEOUS) ×2 IMPLANT
SPONGE LAP 18X18 X RAY DECT (DISPOSABLE) ×4 IMPLANT
STAPLER VISISTAT 35W (STAPLE) ×2 IMPLANT
SUCTION POOLE TIP (SUCTIONS) ×2 IMPLANT
SUT PDS AB 1 TP1 96 (SUTURE) IMPLANT
SUT SILK 2 0 (SUTURE)
SUT SILK 2 0 SH CR/8 (SUTURE) IMPLANT
SUT SILK 2-0 18XBRD TIE 12 (SUTURE) IMPLANT
SUT SILK 3 0 (SUTURE)
SUT SILK 3 0 SH CR/8 (SUTURE) IMPLANT
SUT SILK 3-0 18XBRD TIE 12 (SUTURE) IMPLANT
SUT VIC AB 3-0 SH 27 (SUTURE)
SUT VIC AB 3-0 SH 27X BRD (SUTURE) IMPLANT
TOWEL OR 17X26 10 PK STRL BLUE (TOWEL DISPOSABLE) ×2 IMPLANT
TRAY FOLEY CATH 14FRSI W/METER (CATHETERS) IMPLANT
WATER STERILE IRR 1000ML POUR (IV SOLUTION) IMPLANT
YANKAUER SUCT BULB TIP NO VENT (SUCTIONS) IMPLANT

## 2013-01-25 NOTE — Consult Note (Addendum)
PULMONARY  / CRITICAL CARE MEDICINE  Name: Mackenzie [name] MRN: [id number] DOB: 02-Jul-1986    ADMISSION DATE:  01/21/2013 CONSULTATION DATE:  5/23  REFERRING MD :  Dr Jolayne Panther PRIMARY SERVICE: OB   CHIEF COMPLAINT:  shock  BRIEF PATIENT DESCRIPTION: 27 yr old 58 weeks pregant, STAT Csection after failed labor, uterine rupture, shock post op  SIGNIFICANT EVENTS / STUDIES:  5/23- uterine rupture, massive blood loss and prbc Tx, shock  5/24- retroperitoneal bleed significant  5-24 to OR for wound vac  5-24 to IR for enolization of left iliac artery branch  LINES / TUBES: Left ij 5/23>>>  CULTURES: BC 5/24 resp cult 5/24  ANTIBIOTICS: Zosyn 5/23 (concern infected amniotic)>>>  SUBJECTIVE:  Pt stable on vent.  VITAL SIGNS: Temp:  [99.9 F (37.7 C)-102.8 F (39.3 C)] 99.9 F (37.7 C) (05/26 1200) Pulse Rate:  [103-123] 113 (05/26 1300) Resp:  [11-26] 12 (05/26 1300) BP: (109-136)/(62-79) 125/62 mmHg (05/26 0600) SpO2:  [96 %-100 %] 100 % (05/26 1300) Arterial Line BP: (109-147)/(71-91) 126/91 mmHg (05/26 1000) FiO2 (%):  [40 %] 40 % (05/26 1220) Weight:  [75.2 kg (165 lb 12.6 oz)] 75.2 kg (165 lb 12.6 oz) (05/26 0400) HEMODYNAMICS: CVP:  [4 mmHg] 4 mmHg VENTILATOR SETTINGS: Vent Mode:  [-] PRVC FiO2 (%):  [40 %] 40 % Set Rate:  [14 bmp] 14 bmp Vt Set:  [350 mL] 350 mL PEEP:  [5 cmH20] 5 cmH20 Plateau Pressure:  [15 cmH20] 15 cmH20 INTAKE / OUTPUT: Intake/Output     05/25 0701 - 05/26 0700 05/26 0701 - 05/27 0700   I.V. (mL/kg) 1035 (13.8)    Blood     NG/GT 30    IV Piggyback 112.5    Total Intake(mL/kg) 1177.5 (15.7)    Urine (mL/kg/hr) 1315 (0.7)    Emesis/NG output 450 (0.2)    Drains 3000 (1.7)    Blood     Total Output 4765     Net -3587.5            PHYSICAL EXAMINATION: General:  No distress Neuro:  nonfocal exam HEENT:  No JVD at all Cardiovascular:  s1 s2 rrt disant Lungs: ANterior clear Abdomen:  Distended abdo, wound vac in place Skin:   No rash  LABS:  Recent Labs Lab 01/22/13 1210  01/22/13 1745  01/22/13 1854  01/22/13 2355  01/23/13 0515 01/23/13 0952 01/23/13 1039  01/23/13 2029 01/23/13 2030  01/24/13 0435  01/24/13 1847 01/24/13 2130 01/25/13 0352 01/25/13 0433 01/25/13 0952 01/25/13 1047  HGB  --   < > 7.4*  --   --   < >  --   < > 8.4* 6.3*  --   < >  --  9.9*  < > 9.0*  < >  --  7.9* 8.6*  --  9.7*  --   WBC  --   < > 17.7*  --   --   < >  --   < > 16.5* 14.5*  --   < >  --  12.1*  < > 15.7*  < >  --  20.3* 22.7*  --  28.7*  --   PLT 114*  < > 124*  124*  --   --   < >  --   < > 64*  67* 70*  PLATELETS APPEAR ADEQUATE  --   < >  --  105*  < > 91*  < >  --  117* 136*  --  159  --   NA 135  --  133*  --   --   < > 131*  --  132* 134*  --   --   --  131*  --  134*  < > 133*  --  135  --   --  137  K 3.9  --  3.9  --   --   < > 4.0  --  4.1 4.3  --   --   --  4.5  --  4.5  < > 4.5  --  4.7  --   --  4.8  CL 103  --  102  --   --   < > 100  --  102 104  --   --   --  101  --  105  < > 102  --  102  --   --  106  CO2 24  --  23  --   --   < > 22  --  24 21  --   --   --  23  --  20  < > 23  --  23  --   --  21  GLUCOSE 104*  --  148*  --   --   < > 162*  --  111* 134*  --   --   --  122*  --  100*  < > 92  --  82  --   --  84  BUN 8  --  12  --   --   < > 16  --  17 19  --   --   --  17  --  20  < > 20  --  17  --   --  18  CREATININE 0.84  --  0.77  --   --   < > 0.87  --  0.84 0.88  --   --   --  0.66  --  0.77  < > 0.76  --  0.61  --   --  0.66  CALCIUM 7.8*  --  7.4*  --   --   < > 7.1*  --  7.4* 6.6*  --   --   --  7.0*  --  6.8*  < > 7.6*  --  7.8*  --   --  7.3*  MG  --   --  1.1*  --   --   --   --   --   --   --   --   --   --   --   --   --   --   --   --   --   --   --   --   PHOS  --   --  5.0*  --   --   --   --   --   --   --   --   --   --   --   --   --   --   --   --   --   --   --   --   AST  --   < > 18  --   --   --  22  --  21 19  --   --   --   --   --  59*  --   --   --   --   --   --    --  ALT  --   < > 9  --   --   --  9  --  9 8  --   --   --   --   --  41*  --   --   --   --   --   --   --   ALKPHOS  --   < > 43  --   --   --  44  --  44 36*  --   --   --   --   --  39  --   --   --   --   --   --   --   BILITOT  --   < > 3.1*  --   --   --  1.6*  --  1.3* 0.9  --   --   --   --   --  0.9  --   --   --   --   --   --   --   PROT  --   < > 3.4*  --   --   --  3.5*  --  3.7* 3.3*  --   --   --   --   --  3.9*  --   --   --   --   --   --   --   ALBUMIN  --   < > 1.6*  --   --   --  1.5*  --  1.5* 1.4*  --   --   --   --   --  1.4*  --   --   --   --   --   --   --   APTT 27  --  92*  --   --   --  32  --  31 34  34  --   --   --   --   --   --   --   --   --   --   --   --   --   INR 1.20  --  1.48  --   --   --  1.39  --  1.19 1.33  1.31  --   --   --  1.06  --   --   --   --   --   --   --   --   --   LATICACIDVEN  --   --  2.9*  --   --   --  2.6*  --   --  2.3*  --   --   --   --   --   --   --   --   --   --   --   --   --   TROPONINI  --   --   --   --   --   --  <0.30  --  <0.30  --   --   --   --   --   --   --   --   --   --   --   --   --   --   PROCALCITON  --   --   --   --   --   --   --   --   --  3.33  --   --   --   --   --  4.46  --   --   --   --  3.77  --   --   PHART  --   --   --   < > 7.469*  --  7.457*  --   --   --  7.429  --  7.385  --   --   --   --   --   --   --   --   --   --   PCO2ART  --   --   --   < > 30.7*  --  30.1*  --   --   --  36.2  --  40.8  --   --   --   --   --   --   --   --   --   --   PO2ART  --   --   --   < > 112.0*  --  127.0*  --   --   --  350.0*  --  183.0*  --   --   --   --   --   --   --   --   --   --   < > = values in this interval not displayed.  Recent Labs Lab 01/24/13 1957 01/25/13 0015 01/25/13 0407 01/25/13 0959 01/25/13 1154  GLUCAP 87 88 83 78 82    CXR: none  ASSESSMENT / PLAN:  PULMONARY A: On vent . No TRALI P:   Cont full vent   CARDIOVASCULAR A: Shock, d/t hemorrhage, now resolved.  Off  all vasopressors  P:  Monitor  RENAL A: stable bmet  P:   mtn ivf  GASTROINTESTINAL A: iliac artery bleed s/p hysterectomy.  S/p lap per surgery P:   ppi Per surgery  HEMATOLOGIC A:  S/p massive blood loss d/t iliac artery bleed P:  F/u cbc INFECTIOUS A:  Concern GYN infection amniotic P:   On zosyn   ENDOCRINE A:  No issues  P:   SSI NEUROLOGIC A:  pain P:   Intermittent sedation protocol  TODAY'S SUMMARY: s/p iliac artery bleed s/p hysterectomy with ex lap and control of bleeding ultimately with IR.  I have personally obtained a history, examined the patient, evaluated laboratory and imaging results, formulated the assessment and plan and placed orders. CRITICAL CARE: The patient is critically ill with multiple organ systems failure and requires high complexity decision making for assessment and support, frequent evaluation and titration of therapies, application of advanced monitoring technologies and extensive interpretation of multiple databases. Critical Care Time devoted to patient care services described in this note is 30 minutes.   Dorcas Carrow Pulmonary and Critical Care Medicine [facility]  [phone]  Cell  [phone]  If no response or cell goes to voicemail, call beeper [phone]  01/25/2013, 2:14 PM

## 2013-01-25 NOTE — Progress Notes (Signed)
A friend of the pt was present when I arrived and he was able to translate some for me. I introduced myself to pt and friend and offered spiritual care. Pt said she wanted prayer, which was translated by her friend. Pt was very appreciative for visit and prayer by the nod of her head. Marjory Lies Chaplain  01/25/13 1200  Clinical Encounter Type  Visited With Patient;Other (Comment)

## 2013-01-25 NOTE — Progress Notes (Signed)
Day of Surgery  Subjective: L iliac artery embo 5/24 H/H stable On vent  Objective: Vital signs in last 24 hours: Temp:  [99.9 F (37.7 C)-102.8 F (39.3 C)] 99.9 F (37.7 C) (05/26 0400) Pulse Rate:  [103-123] 117 (05/26 0924) Resp:  [14-26] 14 (05/26 0924) BP: (109-136)/(62-79) 125/62 mmHg (05/26 0600) SpO2:  [96 %-100 %] 96 % (05/26 0924) Arterial Line BP: (109-147)/(71-87) 109/87 mmHg (05/26 0700) FiO2 (%):  [40 %] 40 % (05/26 0925) Weight:  [165 lb 12.6 oz (75.2 kg)] 165 lb 12.6 oz (75.2 kg) (05/26 0400)    Intake/Output from previous day: 05/25 0701 - 05/26 0700 In: 1177.5 [I.V.:1035; NG/GT:30; IV Piggyback:112.5] Out: 4765 [Urine:1315; Emesis/NG output:450; Drains:3000] Intake/Output this shift:    PE:  On vent  Back from OR again- removal of sponges Stable H/H T max 100.2 this am; now 99.9  Lab Results:   Recent Labs  01/24/13 2130 01/25/13 0352  WBC 20.3* 22.7*  HGB 7.9* 8.6*  HCT 22.7* 24.7*  PLT 117* 136*   BMET  Recent Labs  01/24/13 1847 01/25/13 0352  NA 133* 135  K 4.5 4.7  CL 102 102  CO2 23 23  GLUCOSE 92 82  BUN 20 17  CREATININE 0.76 0.61  CALCIUM 7.6* 7.8*   PT/INR  Recent Labs  01/23/13 0952 01/23/13 2030  LABPROT 16.2*  16.0* 13.7  INR 1.33  1.31 1.06   ABG  Recent Labs  01/23/13 1039 01/23/13 2029  PHART 7.429 7.385  HCO3 23.8 23.9    Studies/Results: Ir Angiogram Pelvis Selective Or Supraselective  01/23/2013   *RADIOLOGY REPORT*  Clinical Data: Post C-section hemorrhage, post hysterectomy with persistent abdominal and pelvic bleeding.  Post open abdominal compartment decompression with packing at which time the patient was found to have a large left-sided the pelvic side wall hematoma. The patient is hemodynamically unstable, currently receiving blood products and on vasopressors.  The patient transferred emergently from the operating room for a pelvic arteriogram.  1.  ULTRASOUND GUIDANCE FOR ARTERIAL ACCESS  2.  LEFT INTERNAL ILIAC ARTERIOGRAM 3.  MIDDLE RECTAL ARTERIOGRAM (3rd ORDER) WITH PERCUTANEOUS COIL EMBOLIZATION  Comparison: None  Intravenous Medications: None - the patient was brought emergently from the operating room under general anesthesia  Contrast: 60 ml Omnipaque-300  Fluoroscopy Time: 7 minutes, 6 seconds  Access:  Right common femoral artery; hemostasis achieved with deployment of an Exoseal device.  Complications: None immediate  Technique:  Informed written consent was obtained from the the patient's husband via the use of a Medical interpreter after a discussion of the risks, benefits and alternatives to treatment.  Questions regarding the procedure were encouraged and answered.  A timeout was performed prior to the initiation of the procedure.  The right groin was prepped and draped in the usual sterile fashion, and a sterile drape was applied covering the operative field.  Maximum barrier sterile technique with sterile gowns and gloves were used for the procedure.  A timeout was performed prior to the initiation of the procedure.  Local anesthesia was provided with 1% lidocaine.  The right femoral head was marked fluoroscopically.  Under ultrasound guidance, the right common femoral artery was accessed with a micropuncture kit after the overlying soft tissues were anesthetized with 1% lidocaine.  An ultrasound image was saved for documentation purposes.  The micropuncture sheath was exchanged for a 5 [place] vascular sheath over a Bentson wire.  A closure arteriogram was performed through the side of the sheath confirming  access within the right common femoral artery.  Over a Bentson wire, a C2 catheter was advanced to the pelvic bifurcation where it was back bled and flushed.  The C2 catheter was then utilized to select the contralateral left internal iliac artery and a left internal iliac arteriogram was performed.  With the use of a synchro-14 microwire, a regular renegade microcatheter was  advanced into the distal aspect middle rectal artery to the origin of the extravasation/pseudoaneurysm.  A 5 mm diameter interlock coil was then deployed within the central aspect of the pseudoaneurysm.  Multiple overlapping 2 and 3 mm diameter interlock coils were then deployed along the distal length of the middle rectal artery.  The microcatheter was retracted into the more central aspect of the middle rectal artery and a completion arteriogram was performed.  At this point, the procedure was terminated.  All wires catheters and sheaths removed from the patient.  Hemostasis was achieved at the right groin access site with deployment of an Exoseal closure device.  Hemostasis was achieved with manual compression.  A dressing was placed.  Findings:  Left internal iliac arteriogram demonstrates a focal area of extravasation/pseudoaneurysm formation originating from the distal aspect of the anterior division of the left internal iliac artery, presumably the middle rectal artery.  A microcatheter was advanced to the origin of the extravasation/pseudoaneurysm.  Multiple coils were utilized to occlude the neck of the pseudoaneurysm as well as the distal aspect of the feeding extravasating vessel.  Completion arteriogram was negative for an additional area of discrete vessel irregularity or extravasation.  Impression:  Technically successful coil embolization of a focal area of active extravastaion/pseudoaneurysm arising from the distal aspect of a branch of the anterior division of the left internal iliac artery, presumably the middle rectal artery.   Original Report Authenticated By: Tacey Ruiz, MD   Ir Angiogram Selective Each Additional Vessel  01/23/2013   *RADIOLOGY REPORT*  Clinical Data: Post C-section hemorrhage, post hysterectomy with persistent abdominal and pelvic bleeding.  Post open abdominal compartment decompression with packing at which time the patient was found to have a large left-sided the pelvic  side wall hematoma. The patient is hemodynamically unstable, currently receiving blood products and on vasopressors.  The patient transferred emergently from the operating room for a pelvic arteriogram.  1.  ULTRASOUND GUIDANCE FOR ARTERIAL ACCESS 2.  LEFT INTERNAL ILIAC ARTERIOGRAM 3.  MIDDLE RECTAL ARTERIOGRAM (3rd ORDER) WITH PERCUTANEOUS COIL EMBOLIZATION  Comparison: None  Intravenous Medications: None - the patient was brought emergently from the operating room under general anesthesia  Contrast: 60 ml Omnipaque-300  Fluoroscopy Time: 7 minutes, 6 seconds  Access:  Right common femoral artery; hemostasis achieved with deployment of an Exoseal device.  Complications: None immediate  Technique:  Informed written consent was obtained from the the patient's husband via the use of a Medical interpreter after a discussion of the risks, benefits and alternatives to treatment.  Questions regarding the procedure were encouraged and answered.  A timeout was performed prior to the initiation of the procedure.  The right groin was prepped and draped in the usual sterile fashion, and a sterile drape was applied covering the operative field.  Maximum barrier sterile technique with sterile gowns and gloves were used for the procedure.  A timeout was performed prior to the initiation of the procedure.  Local anesthesia was provided with 1% lidocaine.  The right femoral head was marked fluoroscopically.  Under ultrasound guidance, the right common femoral artery was accessed  with a micropuncture kit after the overlying soft tissues were anesthetized with 1% lidocaine.  An ultrasound image was saved for documentation purposes.  The micropuncture sheath was exchanged for a 5 [place] vascular sheath over a Bentson wire.  A closure arteriogram was performed through the side of the sheath confirming access within the right common femoral artery.  Over a Bentson wire, a C2 catheter was advanced to the pelvic bifurcation where it was  back bled and flushed.  The C2 catheter was then utilized to select the contralateral left internal iliac artery and a left internal iliac arteriogram was performed.  With the use of a synchro-14 microwire, a regular renegade microcatheter was advanced into the distal aspect middle rectal artery to the origin of the extravasation/pseudoaneurysm.  A 5 mm diameter interlock coil was then deployed within the central aspect of the pseudoaneurysm.  Multiple overlapping 2 and 3 mm diameter interlock coils were then deployed along the distal length of the middle rectal artery.  The microcatheter was retracted into the more central aspect of the middle rectal artery and a completion arteriogram was performed.  At this point, the procedure was terminated.  All wires catheters and sheaths removed from the patient.  Hemostasis was achieved at the right groin access site with deployment of an Exoseal closure device.  Hemostasis was achieved with manual compression.  A dressing was placed.  Findings:  Left internal iliac arteriogram demonstrates a focal area of extravasation/pseudoaneurysm formation originating from the distal aspect of the anterior division of the left internal iliac artery, presumably the middle rectal artery.  A microcatheter was advanced to the origin of the extravasation/pseudoaneurysm.  Multiple coils were utilized to occlude the neck of the pseudoaneurysm as well as the distal aspect of the feeding extravasating vessel.  Completion arteriogram was negative for an additional area of discrete vessel irregularity or extravasation.  Impression:  Technically successful coil embolization of a focal area of active extravastaion/pseudoaneurysm arising from the distal aspect of a branch of the anterior division of the left internal iliac artery, presumably the middle rectal artery.   Original Report Authenticated By: Tacey Ruiz, MD   Ir Angiogram Follow Up Study  01/23/2013   *RADIOLOGY REPORT*  Clinical  Data: Post C-section hemorrhage, post hysterectomy with persistent abdominal and pelvic bleeding.  Post open abdominal compartment decompression with packing at which time the patient was found to have a large left-sided the pelvic side wall hematoma. The patient is hemodynamically unstable, currently receiving blood products and on vasopressors.  The patient transferred emergently from the operating room for a pelvic arteriogram.  1.  ULTRASOUND GUIDANCE FOR ARTERIAL ACCESS 2.  LEFT INTERNAL ILIAC ARTERIOGRAM 3.  MIDDLE RECTAL ARTERIOGRAM (3rd ORDER) WITH PERCUTANEOUS COIL EMBOLIZATION  Comparison: None  Intravenous Medications: None - the patient was brought emergently from the operating room under general anesthesia  Contrast: 60 ml Omnipaque-300  Fluoroscopy Time: 7 minutes, 6 seconds  Access:  Right common femoral artery; hemostasis achieved with deployment of an Exoseal device.  Complications: None immediate  Technique:  Informed written consent was obtained from the the patient's husband via the use of a Medical interpreter after a discussion of the risks, benefits and alternatives to treatment.  Questions regarding the procedure were encouraged and answered.  A timeout was performed prior to the initiation of the procedure.  The right groin was prepped and draped in the usual sterile fashion, and a sterile drape was applied covering the operative field.  Maximum barrier  sterile technique with sterile gowns and gloves were used for the procedure.  A timeout was performed prior to the initiation of the procedure.  Local anesthesia was provided with 1% lidocaine.  The right femoral head was marked fluoroscopically.  Under ultrasound guidance, the right common femoral artery was accessed with a micropuncture kit after the overlying soft tissues were anesthetized with 1% lidocaine.  An ultrasound image was saved for documentation purposes.  The micropuncture sheath was exchanged for a 5 [place] vascular sheath  over a Bentson wire.  A closure arteriogram was performed through the side of the sheath confirming access within the right common femoral artery.  Over a Bentson wire, a C2 catheter was advanced to the pelvic bifurcation where it was back bled and flushed.  The C2 catheter was then utilized to select the contralateral left internal iliac artery and a left internal iliac arteriogram was performed.  With the use of a synchro-14 microwire, a regular renegade microcatheter was advanced into the distal aspect middle rectal artery to the origin of the extravasation/pseudoaneurysm.  A 5 mm diameter interlock coil was then deployed within the central aspect of the pseudoaneurysm.  Multiple overlapping 2 and 3 mm diameter interlock coils were then deployed along the distal length of the middle rectal artery.  The microcatheter was retracted into the more central aspect of the middle rectal artery and a completion arteriogram was performed.  At this point, the procedure was terminated.  All wires catheters and sheaths removed from the patient.  Hemostasis was achieved at the right groin access site with deployment of an Exoseal closure device.  Hemostasis was achieved with manual compression.  A dressing was placed.  Findings:  Left internal iliac arteriogram demonstrates a focal area of extravasation/pseudoaneurysm formation originating from the distal aspect of the anterior division of the left internal iliac artery, presumably the middle rectal artery.  A microcatheter was advanced to the origin of the extravasation/pseudoaneurysm.  Multiple coils were utilized to occlude the neck of the pseudoaneurysm as well as the distal aspect of the feeding extravasating vessel.  Completion arteriogram was negative for an additional area of discrete vessel irregularity or extravasation.  Impression:  Technically successful coil embolization of a focal area of active extravastaion/pseudoaneurysm arising from the distal aspect of a  branch of the anterior division of the left internal iliac artery, presumably the middle rectal artery.   Original Report Authenticated By: Tacey Ruiz, MD   Ir US Guide Vasc Access Right  01/23/2013   *RADIOLOGY REPORT*  Clinical Data: Post C-section hemorrhage, post hysterectomy with persistent abdominal and pelvic bleeding.  Post open abdominal compartment decompression with packing at which time the patient was found to have a large left-sided the pelvic side wall hematoma. The patient is hemodynamically unstable, currently receiving blood products and on vasopressors.  The patient transferred emergently from the operating room for a pelvic arteriogram.  1.  ULTRASOUND GUIDANCE FOR ARTERIAL ACCESS 2.  LEFT INTERNAL ILIAC ARTERIOGRAM 3.  MIDDLE RECTAL ARTERIOGRAM (3rd ORDER) WITH PERCUTANEOUS COIL EMBOLIZATION  Comparison: None  Intravenous Medications: None - the patient was brought emergently from the operating room under general anesthesia  Contrast: 60 ml Omnipaque-300  Fluoroscopy Time: 7 minutes, 6 seconds  Access:  Right common femoral artery; hemostasis achieved with deployment of an Exoseal device.  Complications: None immediate  Technique:  Informed written consent was obtained from the the patient's husband via the use of a Medical interpreter after a discussion of the risks,  benefits and alternatives to treatment.  Questions regarding the procedure were encouraged and answered.  A timeout was performed prior to the initiation of the procedure.  The right groin was prepped and draped in the usual sterile fashion, and a sterile drape was applied covering the operative field.  Maximum barrier sterile technique with sterile gowns and gloves were used for the procedure.  A timeout was performed prior to the initiation of the procedure.  Local anesthesia was provided with 1% lidocaine.  The right femoral head was marked fluoroscopically.  Under ultrasound guidance, the right common femoral artery was  accessed with a micropuncture kit after the overlying soft tissues were anesthetized with 1% lidocaine.  An ultrasound image was saved for documentation purposes.  The micropuncture sheath was exchanged for a 5 [place] vascular sheath over a Bentson wire.  A closure arteriogram was performed through the side of the sheath confirming access within the right common femoral artery.  Over a Bentson wire, a C2 catheter was advanced to the pelvic bifurcation where it was back bled and flushed.  The C2 catheter was then utilized to select the contralateral left internal iliac artery and a left internal iliac arteriogram was performed.  With the use of a synchro-14 microwire, a regular renegade microcatheter was advanced into the distal aspect middle rectal artery to the origin of the extravasation/pseudoaneurysm.  A 5 mm diameter interlock coil was then deployed within the central aspect of the pseudoaneurysm.  Multiple overlapping 2 and 3 mm diameter interlock coils were then deployed along the distal length of the middle rectal artery.  The microcatheter was retracted into the more central aspect of the middle rectal artery and a completion arteriogram was performed.  At this point, the procedure was terminated.  All wires catheters and sheaths removed from the patient.  Hemostasis was achieved at the right groin access site with deployment of an Exoseal closure device.  Hemostasis was achieved with manual compression.  A dressing was placed.  Findings:  Left internal iliac arteriogram demonstrates a focal area of extravasation/pseudoaneurysm formation originating from the distal aspect of the anterior division of the left internal iliac artery, presumably the middle rectal artery.  A microcatheter was advanced to the origin of the extravasation/pseudoaneurysm.  Multiple coils were utilized to occlude the neck of the pseudoaneurysm as well as the distal aspect of the feeding extravasating vessel.  Completion arteriogram  was negative for an additional area of discrete vessel irregularity or extravasation.  Impression:  Technically successful coil embolization of a focal area of active extravastaion/pseudoaneurysm arising from the distal aspect of a branch of the anterior division of the left internal iliac artery, presumably the middle rectal artery.   Original Report Authenticated By: Tacey Ruiz, MD   Dg Chest Port 1 View  01/25/2013   *RADIOLOGY REPORT*  Clinical Data: Endotracheal tube position.  PORTABLE CHEST - 1 VIEW  Comparison: 01/24/2013.  Findings: The endotracheal tube is in good position, unchanged. The left IJ catheter and NG tubes are stable.  The heart is mildly enlarged but unchanged.  Persistent bibasilar atelectasis or infiltrates and small effusions.  IMPRESSION:  1.  Stable support apparatus. 2.  Persistent effusions and bibasilar atelectasis or infiltrates.   Original Report Authenticated By: Rudie Meyer, M.D.   Dg Abd Portable 1v  01/25/2013   *RADIOLOGY REPORT*  Clinical Data: Abnormal sponge count during laparotomy.  History of a ruptured uterus.  PORTABLE ABDOMEN - 1 VIEW  Comparison: None.  Findings: An  NG tube is in the stomach.  The bowel is dilated and centralized.  No definite radiopaque sponges are identified in the abdomen. Coils are noted in the pelvis.  IMPRESSION: No radiopaque sponges are identified.   Original Report Authenticated By: Rudie Meyer, M.D.   Ir [facility] [place] [facility] Scripts Guide Roadmapping  01/23/2013   *RADIOLOGY REPORT*  Clinical Data: Post C-section hemorrhage, post hysterectomy with persistent abdominal and pelvic bleeding.  Post open abdominal compartment decompression with packing at which time the patient was found to have a large left-sided the pelvic side wall hematoma. The patient is hemodynamically unstable, currently receiving blood products and on vasopressors.  The patient transferred emergently from the operating room for a pelvic arteriogram.   1.  ULTRASOUND GUIDANCE FOR ARTERIAL ACCESS 2.  LEFT INTERNAL ILIAC ARTERIOGRAM 3.  MIDDLE RECTAL ARTERIOGRAM (3rd ORDER) WITH PERCUTANEOUS COIL EMBOLIZATION  Comparison: None  Intravenous Medications: None - the patient was brought emergently from the operating room under general anesthesia  Contrast: 60 ml Omnipaque-300  Fluoroscopy Time: 7 minutes, 6 seconds  Access:  Right common femoral artery; hemostasis achieved with deployment of an Exoseal device.  Complications: None immediate  Technique:  Informed written consent was obtained from the the patient's husband via the use of a Medical interpreter after a discussion of the risks, benefits and alternatives to treatment.  Questions regarding the procedure were encouraged and answered.  A timeout was performed prior to the initiation of the procedure.  The right groin was prepped and draped in the usual sterile fashion, and a sterile drape was applied covering the operative field.  Maximum barrier sterile technique with sterile gowns and gloves were used for the procedure.  A timeout was performed prior to the initiation of the procedure.  Local anesthesia was provided with 1% lidocaine.  The right femoral head was marked fluoroscopically.  Under ultrasound guidance, the right common femoral artery was accessed with a micropuncture kit after the overlying soft tissues were anesthetized with 1% lidocaine.  An ultrasound image was saved for documentation purposes.  The micropuncture sheath was exchanged for a 5 [place] vascular sheath over a Bentson wire.  A closure arteriogram was performed through the side of the sheath confirming access within the right common femoral artery.  Over a Bentson wire, a C2 catheter was advanced to the pelvic bifurcation where it was back bled and flushed.  The C2 catheter was then utilized to select the contralateral left internal iliac artery and a left internal iliac arteriogram was performed.  With the use of a synchro-14  microwire, a regular renegade microcatheter was advanced into the distal aspect middle rectal artery to the origin of the extravasation/pseudoaneurysm.  A 5 mm diameter interlock coil was then deployed within the central aspect of the pseudoaneurysm.  Multiple overlapping 2 and 3 mm diameter interlock coils were then deployed along the distal length of the middle rectal artery.  The microcatheter was retracted into the more central aspect of the middle rectal artery and a completion arteriogram was performed.  At this point, the procedure was terminated.  All wires catheters and sheaths removed from the patient.  Hemostasis was achieved at the right groin access site with deployment of an Exoseal closure device.  Hemostasis was achieved with manual compression.  A dressing was placed.  Findings:  Left internal iliac arteriogram demonstrates a focal area of extravasation/pseudoaneurysm formation originating from the distal aspect of the anterior division of the left internal  iliac artery, presumably the middle rectal artery.  A microcatheter was advanced to the origin of the extravasation/pseudoaneurysm.  Multiple coils were utilized to occlude the neck of the pseudoaneurysm as well as the distal aspect of the feeding extravasating vessel.  Completion arteriogram was negative for an additional area of discrete vessel irregularity or extravasation.  Impression:  Technically successful coil embolization of a focal area of active extravastaion/pseudoaneurysm arising from the distal aspect of a branch of the anterior division of the left internal iliac artery, presumably the middle rectal artery.   Original Report Authenticated By: Tacey Ruiz, MD    Anti-infectives:   Assessment/Plan: s/p Procedure(s): EXPLORATORY LAPAROTOMY (N/A) ABDOMINAL VACUUM ASSISTED CLOSURE CHANGE (N/A)  L iliac artery embo 5/24 in IR Stabilizing Followed by [facility] Call us need [place]   LOS: 4 days    Kiaira Pointer A 01/25/2013

## 2013-01-25 NOTE — Progress Notes (Signed)
Post Partum Day 3 Subjective: Patient found intubated and sleeping.  Objective: Blood pressure 125/62, pulse 117, temperature 99.9 F (37.7 C), temperature source Oral, resp. rate 14, height 5\' 1"  (1.549 m), weight 165 lb 12.6 oz (75.2 kg), last menstrual period 03/22/2012, SpO2 96.00%.  Intake/Output     05/25 0701 - 05/26 0700 05/26 0701 - 05/27 0700   I.V. (mL/kg) 1035 (13.8)    Blood     NG/GT 30    IV Piggyback 112.5    Total Intake(mL/kg) 1177.5 (15.7)    Urine (mL/kg/hr) 1315 (0.7)    Emesis/NG output 450 (0.2)    Drains 3000 (1.7)    Blood     Total Output 4765     Net -3587.5           Physical Exam:  Lungs: bilateral basal rhochi Heart: S1S2 Abdomen: soft, abdominal vac in place Incisions: 1) Pfannenstiel incision healing well, no erythema, induration, staples in place                 2) Vertical incision with abdominal vac in place DVT Evaluation: No cords or calf tenderness. Calf/Ankle edema is present.   Recent Labs  01/24/13 2130 01/25/13 0352  HGB 7.9* 8.6*  HCT 22.7* 24.7*    Assessment/Plan: Patient postpartum day #3 s/p cesarean section with hysterectomy secondary to uterine rupture complicated by hemorrhagic shock s/p IR embolization of pelvic vessel s/p removal of abdominal packing this morning - Patient currently hemodynamically stable - Planning abdominal closure on Wednesday  - Continue current care by SICU   LOS: 4 days   Mackenzie [name] 01/25/2013, 10:09 AM

## 2013-01-25 NOTE — Progress Notes (Signed)
INITIAL NUTRITION ASSESSMENT  DOCUMENTATION CODES Per approved criteria  -Not Applicable   INTERVENTION:  If unable to extubate patient in the next 24 hours and able to use gut, recommend initiate TF via OGT with Jevity 1.2 at 20 ml/h, increase by 10 ml every 4 hours to goal rate of 60 ml/h to provide 1728 kcals, 80 gm protein, 1166 ml free water daily.  If unable to use GI tract for nutrition within the next 4-5 days, recommend start TPN.  NUTRITION DIAGNOSIS: Inadequate oral intake related to inability to eat as evidenced by NPO status.   Goal: Intake to meet >90% of estimated nutrition needs.  Monitor:  Enteral vs parenteral nutrition initiation/tolerance/adequacy, weight trend, labs, vent status.  Reason for Assessment: VDRF  27 y.o. female  Admitting Dx: Hemorrhagic shock  ASSESSMENT: 27 yr old 55 weeks pregant, STAT C-section 5/23 after failed labor, uterine rupture, shock post op. Pt continued to bleed overnight and found in OR 5/24 AM to have significant retroperitoneal bleed. Hemorrhage status post C-section followed by hysterectomy for rupture, s/p laparotomy with intra-abdominal packing and then IR coiling of bleeding pelvic vessel. VAC in place to abdominal wound. Noted patient may require TPN pending ability to use gut.  Patient is currently intubated on ventilator support.  MV: 4.9 Temp:Temp (24hrs), Avg:100.7 F (38.2 C), Min:99.9 F (37.7 C), Max:102.8 F (39.3 C)   Height: Ht Readings from Last 1 Encounters:  01/21/13 5\' 1"  (1.549 m)    Weight: Wt Readings from Last 1 Encounters:  01/25/13 165 lb 12.6 oz (75.2 kg)    Ideal Body Weight: 47.7 kg  % Ideal Body Weight: 158%  Wt Readings from Last 10 Encounters:  01/25/13 165 lb 12.6 oz (75.2 kg)  01/25/13 165 lb 12.6 oz (75.2 kg)  01/25/13 165 lb 12.6 oz (75.2 kg)  01/25/13 165 lb 12.6 oz (75.2 kg)  01/25/13 165 lb 12.6 oz (75.2 kg)  01/25/13 165 lb 12.6 oz (75.2 kg)  01/25/13 165 lb 12.6 oz  (75.2 kg)    Usual Body Weight: unsure of pre-pregnancy weight   BMI:  Body mass index is 31.34 kg/(m^2).  Estimated Nutritional Needs: Kcal: 1600-1800 Protein: 80-100 gm Fluid: 1.6-1.8 L  Skin: perineal and abdominal incisions; VAC to abdominal surgical wound  Diet Order:  NPO  EDUCATION NEEDS: -No education needs identified at this time   Intake/Output Summary (Last 24 hours) at 01/25/13 1203 Last data filed at 01/25/13 0700  Gross per 24 hour  Intake    875 ml  Output   3765 ml  Net  -2890 ml    Labs:   Recent Labs Lab 01/22/13 1210 01/22/13 1745  01/24/13 1847 01/25/13 0352 01/25/13 1047  NA 135 133*  < > 133* 135 137  K 3.9 3.9  < > 4.5 4.7 4.8  CL 103 102  < > 102 102 106  CO2 24 23  < > 23 23 21   BUN 8 12  < > 20 17 18   CREATININE 0.84 0.77  < > 0.76 0.61 0.66  CALCIUM 7.8* 7.4*  < > 7.6* 7.8* 7.3*  MG  --  1.1*  --   --   --   --   PHOS  --  5.0*  --   --   --   --   GLUCOSE 104* 148*  < > 92 82 84  < > = values in this interval not displayed.  CBG (last 3)   Recent Labs  01/24/13  1957 01/25/13 0015 01/25/13 0407  GLUCAP 87 88 83    Scheduled Meds: . antiseptic oral rinse  15 mL Mouth Rinse QID  . chlorhexidine  15 mL Mouth Rinse BID  . insulin aspart  0-9 Units Subcutaneous Q4H  . midazolam  2-4 mg Intravenous Once  . pantoprazole (PROTONIX) IV  40 mg Intravenous QHS  . piperacillin-tazobactam  3.375 g Intravenous Q8H  . TDaP  0.5 mL Intramuscular Once    Continuous Infusions: . sodium chloride 20 mL/hr at 01/23/13 0700  . fentaNYL infusion INTRAVENOUS 175 mcg/hr (01/24/13 2123)  . phenylephrine (NEO-SYNEPHRINE) Adult infusion Stopped (01/22/13 1845)    Past Medical History  Diagnosis Date  . Varicose veins   . Hx of macrosomia in infant in prior pregnancy, currently pregnant     Past Surgical History  Procedure Laterality Date  . Cesarean section      Joaquin Courts, RD, LDN, CNSC Pager [phone] After Hours Pager  [phone]

## 2013-01-25 NOTE — Anesthesia Preprocedure Evaluation (Signed)
Anesthesia Evaluation  Patient identified by MRN, date of birth, ID band Patient awake    Reviewed: Allergy & Precautions, H&P , NPO status , Patient's Chart, lab work & pertinent test results, reviewed documented beta blocker date and time   Airway Mallampati: II TM Distance: >3 FB Neck ROM: full    Dental no notable dental hx.    Pulmonary neg pulmonary ROS,  Intubated from unit  breath sounds clear to auscultation  Pulmonary exam normal       Cardiovascular negative cardio ROS  Rhythm:regular Rate:Normal     Neuro/Psych negative neurological ROS  negative psych ROS   GI/Hepatic negative GI ROS, Neg liver ROS,   Endo/Other  negative endocrine ROS  Renal/GU      Musculoskeletal   Abdominal   Peds  Hematology negative hematology ROS (+) hemorrhage   Anesthesia Other Findings Varicose veins     Hx of macrosomia in infant in prior pregnancy, post op C section on 5/22 and hysterectomy on 5/23 for uterine rupture and hemorrhage.  Hct 17 after 11 units prbcs and 4 units FFP and 1 pack platelets at [facility] .  No past medical history per husband, interpreter was present in ICU.  No anesthetic complications per husband.  Existing ETT, central line and arterial line.  Verified placement of ETT prior to transfer from ICU to OR.  Carlynn Herald, CRNA    Reproductive/Obstetrics (+) Pregnancy                           Anesthesia Physical Anesthesia Plan  ASA: III  Anesthesia Plan: General   Post-op Pain Management:    Induction: Intravenous  Airway Management Planned: Oral ETT  Additional Equipment:   Intra-op Plan:   Post-operative Plan: Post-operative intubation/ventilation  Informed Consent:   Plan Discussed with: CRNA and Surgeon  Anesthesia Plan Comments:         Anesthesia Quick Evaluation

## 2013-01-25 NOTE — Anesthesia Postprocedure Evaluation (Signed)
  Anesthesia Post-op Note  Patient: Mackenzie [name]  Procedure(s) Performed: Procedure(s): EXPLORATORY LAPAROTOMY (N/A)  Patient Location: SICU  Anesthesia Type:General  Level of Consciousness: Patient remains intubated per anesthesia plan  Airway and Oxygen Therapy: Patient remains intubated per anesthesia plan  Post-op Pain: mild  Post-op Assessment: Post-op Vital signs reviewed and Patient's Cardiovascular Status Stable  Post-op Vital Signs: stable  Complications: No apparent anesthesia complications

## 2013-01-25 NOTE — Op Note (Signed)
Preoperative diagnosis: Hemorrhage status post C-section followed by hysterectomy for rupture, s/p laparotomy with intra-abdominal packing and then IR coiling of bleeding pelvic vessel Postoperative diagnosis: Same as above Procedure: Removal of VAC sponge,exploratory laparotomy, placement of VAC sponge Surgeon: Dr. Harden Mo Asst.: None Anesthesia: Gen. Estimated blood loss: Minimal Drains: Factor 2 open abdomen Complications: None Sponge needle count is correct at end of operation, sponge count was incorrect after I packed her from the previous operation. I removed 25 sponges from her abdomen. 2 films were taken showing no remaining sponges. I left one sponge in her abdomen near the site of her pelvic hematoma this time. Disposition to ICU.  Indications: This is a 27 year old female who went to the operating room at [facility] for a C-section. At that time her baby was delivered but she was noted to have a uterine rupture and hemorrhage. The decision was made to proceed with a hysterectomy for which she lost about 3 L of blood. Postoperatively she was tachycardic and she was re-explored at [facility] for possible bleeding. Her uterine pedicles were both not bleeding but she had a very significant left-sided retroperitoneal hematoma. She was then transferred to [facility]. I explored her as she was in hemorrhagic shock upon arrival. I then packed her abdomen. She was then taken to interventional radiology where she had coiling of a bleeding pelvic vessel. Since then she has stabilized and has had no more bleeding. Her abdomen remained to open and she's been in the ICU on the ventilator. I discussed with her husband returned to the operating room today to possibly close her abdomen.  Procedure: After informed consent was obtained was obtained from her husband she was taken to the operating room. She did not receive any blood products since her last procedure. She has been on antibiotics. Sequential  compression devices are on her legs. She was placed under general anesthesia. Her abdomen was then prepped and draped in the standard sterile surgical fashion after removing the VAC sponge. A surgical timeout was performed.  I explored her abdomen. There was no evidence of any more active bleeding. There is a large amount of old hematoma present on the left side. The retroperitoneum where stitches were had ruptured and there is a large amount of old clot I removed. I removed 25 laparotomy sponges. This was not in accordance with the previous count. I'm not sure that the count was correct in the first place. I did take 2 x-rays in the operating room that showed no more sponges and I could not identify any more sponges after exploring her abdomen thoroughly. I then irrigated copiously. There was no active bleeding. I did choose to leave one sponge on this pelvic hematoma. I attempted to close her abdomen but that she is still very swollen and edematous. There is no way that this was going to be closed today. I then placed another abdominal VAC sponge. This was secured and it was functional upon completion.

## 2013-01-26 ENCOUNTER — Inpatient Hospital Stay (HOSPITAL_COMMUNITY): Payer: Medicaid Other

## 2013-01-26 ENCOUNTER — Encounter (HOSPITAL_COMMUNITY): Payer: Self-pay | Admitting: Obstetrics & Gynecology

## 2013-01-26 DIAGNOSIS — D65 Disseminated intravascular coagulation [defibrination syndrome]: Secondary | ICD-10-CM

## 2013-01-26 LAB — PREPARE FRESH FROZEN PLASMA
Unit division: 0
Unit division: 0

## 2013-01-26 LAB — CBC
Hemoglobin: 7 g/dL — ABNORMAL LOW (ref 12.0–15.0)
MCH: 29.8 pg (ref 26.0–34.0)
MCHC: 33.6 g/dL (ref 30.0–36.0)
MCHC: 33.7 g/dL (ref 30.0–36.0)
MCV: 88.6 fL (ref 78.0–100.0)
Platelets: 135 10*3/uL — ABNORMAL LOW (ref 150–400)
RBC: 2.33 MIL/uL — ABNORMAL LOW (ref 3.87–5.11)
RDW: 15.3 % (ref 11.5–15.5)
WBC: 21.6 10*3/uL — ABNORMAL HIGH (ref 4.0–10.5)

## 2013-01-26 LAB — POCT I-STAT 7, (LYTES, BLD GAS, ICA,H+H)
Bicarbonate: 23.5 mEq/L (ref 20.0–24.0)
Hemoglobin: 3.4 g/dL — CL (ref 12.0–15.0)
Patient temperature: 37
Potassium: 4.1 mEq/L (ref 3.5–5.1)
TCO2: 25 mmol/L (ref 0–100)
pCO2 arterial: 39.9 mmHg (ref 35.0–45.0)
pH, Arterial: 7.378 (ref 7.350–7.450)
pO2, Arterial: 317 mmHg — ABNORMAL HIGH (ref 80.0–100.0)

## 2013-01-26 LAB — BASIC METABOLIC PANEL
CO2: 23 mEq/L (ref 19–32)
Calcium: 7.4 mg/dL — ABNORMAL LOW (ref 8.4–10.5)
Creatinine, Ser: 0.6 mg/dL (ref 0.50–1.10)
GFR calc non Af Amer: >90 mL/min (ref >90)
Glucose, Bld: 88 mg/dL (ref 70–99)

## 2013-01-26 LAB — CULTURE, RESPIRATORY W GRAM STAIN

## 2013-01-26 LAB — GLUCOSE, CAPILLARY: Glucose-Capillary: 76 mg/dL (ref 70–99)

## 2013-01-26 MED FILL — Midazolam HCl Inj 2 MG/2ML (Base Equivalent): INTRAMUSCULAR | Qty: 2 | Status: AC

## 2013-01-26 MED FILL — Gelatin Absorbable Sponge 12-7 MM: CUTANEOUS | Qty: 1 | Status: AC

## 2013-01-26 NOTE — Progress Notes (Signed)
I have seen and examined the pt and agree with PA-Obsorne's progress note.  OR in AM for washout and possible closure. Transfuse as needed. Doesn't seem symptomatic at this time.

## 2013-01-26 NOTE — Progress Notes (Signed)
Chaplain Note:  Chaplain visited with pt who was in bed, intubated, awake, and alert.  There was a visitor at bedside who spoke little [place].  Pt, also, speaks almost no English but nodded in the affirmative when the chaplain made a hand sign indicating prayer.  Pt non-verbally expressed appreciation for chaplain support.  Chaplain will follow up as needed.  01/26/13 1300  Clinical Encounter Type  Visited With Patient and family together  Visit Type Follow-up;Spiritual support  Referral From Chaplain  Spiritual Encounters  Spiritual Needs Prayer;Emotional  Stress Factors  Patient Stress Factors Health changes;Major life changes  Family Stress Factors Not reviewed   Verdie Shire, Chaplain [phone]

## 2013-01-26 NOTE — Consult Note (Signed)
PULMONARY  / CRITICAL CARE MEDICINE  Name: Mackenzie [name] MRN: [id number] DOB: [birth year]    ADMISSION DATE:  01/21/2013 CONSULTATION DATE:  5/23  REFERRING MD :  Dr Jolayne Panther PRIMARY SERVICE: OB   CHIEF COMPLAINT:  shock  BRIEF PATIENT DESCRIPTION: 27 yr old 73 weeks pregant, STAT Csection after failed labor, uterine rupture, shock post op  SIGNIFICANT EVENTS / STUDIES:  5/23- uterine rupture, massive blood loss and prbc Tx, shock  5/24- retroperitoneal bleed significant  5-24 to OR for wound vac  5-24 to IR for embolization of left iliac artery branch  LINES / TUBES: Left ij 5/23>>> 5/24 ETT>>>  CULTURES: [facility] 5/24 resp cult 5/24>>>NF  ANTIBIOTICS: Zosyn 5/23 (concern infected amniotic)>>>  SUBJECTIVE:  No pressors, awake  VITAL SIGNS: Temp:  [99.3 F (37.4 C)-101.1 F (38.4 C)] 99.3 F (37.4 C) (05/27 0806) Pulse Rate:  [99-123] 99 (05/27 0821) Resp:  [11-23] 22 (05/27 0821) BP: (113-119)/(55-85) 119/72 mmHg (05/27 0821) SpO2:  [97 %-100 %] 99 % (05/27 0821) Arterial Line BP: (126)/(91) 126/91 mmHg (05/26 1000) FiO2 (%):  [40 %] 40 % (05/27 0821) Weight:  [154 lb 8.7 oz (70.1 kg)] 154 lb 8.7 oz (70.1 kg) (05/27 0600) HEMODYNAMICS:   VENTILATOR SETTINGS: Vent Mode:  [-] PRVC FiO2 (%):  [40 %] 40 % Set Rate:  [14 bmp] 14 bmp Vt Set:  [350 mL] 350 mL PEEP:  [5 cmH20] 5 cmH20 Plateau Pressure:  [10 cmH20-15 cmH20] 10 cmH20 INTAKE / OUTPUT: Intake/Output     05/26 0701 - 05/27 0700 05/27 0701 - 05/28 0700   I.V. (mL/kg) 1667.5 (23.8)    NG/GT 30    IV Piggyback 150    Total Intake(mL/kg) 1847.5 (26.4)    Urine (mL/kg/hr) 985 (0.6)    Emesis/NG output 350 (0.2)    Drains 1000 (0.6)    Blood 50 (0)    Total Output 2385     Net -537.5            PHYSICAL EXAMINATION: General:  Vented, awakns Neuro:  nonfocal exam, follows commands HEENT:  No JVD at all Cardiovascular:  s1 s2 rrt disant Lungs: coarse diffuse Abdomen:  Distended abdo, wound vac in  place, open Skin:  No rash  LABS:  Recent Labs Lab 01/22/13 1210  01/22/13 1745  01/22/13 1854  01/22/13 2355  01/23/13 0515 01/23/13 0952 01/23/13 1039  01/23/13 2029 01/23/13 2030  01/24/13 0435  01/25/13 0352 01/25/13 0433 01/25/13 0952 01/25/13 1047 01/25/13 1847 01/26/13 0400  HGB  --   < > 7.4*  --   --   < >  --   < > 8.4* 6.3*  --   < >  --  9.9*  < > 9.0*  < > 8.6*  --  9.7*  --   --  7.3*  WBC  --   < > 17.7*  --   --   < >  --   < > 16.5* 14.5*  --   < >  --  12.1*  < > 15.7*  < > 22.7*  --  28.7*  --   --  22.4*  PLT 114*  < > 124*  124*  --   --   < >  --   < > 64*  67* 70*  PLATELETS APPEAR ADEQUATE  --   < >  --  105*  < > 91*  < > 136*  --  159  --   --  135*  NA 135  --  133*  --   --   < > 131*  --  132* 134*  --   --   --  131*  --  134*  < > 135  --   --  137 138 139  K 3.9  --  3.9  --   --   < > 4.0  --  4.1 4.3  --   --   --  4.5  --  4.5  < > 4.7  --   --  4.8 4.5 4.0  CL 103  --  102  --   --   < > 100  --  102 104  --   --   --  101  --  105  < > 102  --   --  106 105 105  CO2 24  --  23  --   --   < > 22  --  24 21  --   --   --  23  --  20  < > 23  --   --  21 22 23   GLUCOSE 104*  --  148*  --   --   < > 162*  --  111* 134*  --   --   --  122*  --  100*  < > 82  --   --  84 86 88  BUN 8  --  12  --   --   < > 16  --  17 19  --   --   --  17  --  20  < > 17  --   --  18 17 15   CREATININE 0.84  --  0.77  --   --   < > 0.87  --  0.84 0.88  --   --   --  0.66  --  0.77  < > 0.61  --   --  0.66 0.64 0.60  CALCIUM 7.8*  --  7.4*  --   --   < > 7.1*  --  7.4* 6.6*  --   --   --  7.0*  --  6.8*  < > 7.8*  --   --  7.3* 7.6* 7.4*  MG  --   --  1.1*  --   --   --   --   --   --   --   --   --   --   --   --   --   --   --   --   --   --   --   --   PHOS  --   --  5.0*  --   --   --   --   --   --   --   --   --   --   --   --   --   --   --   --   --   --   --   --   AST  --   < > 18  --   --   --  22  --  21 19  --   --   --   --   --  59*  --   --   --   --    --   --   --   ALT  --   < >  9  --   --   --  9  --  9 8  --   --   --   --   --  41*  --   --   --   --   --   --   --   ALKPHOS  --   < > 43  --   --   --  44  --  44 36*  --   --   --   --   --  39  --   --   --   --   --   --   --   BILITOT  --   < > 3.1*  --   --   --  1.6*  --  1.3* 0.9  --   --   --   --   --  0.9  --   --   --   --   --   --   --   PROT  --   < > 3.4*  --   --   --  3.5*  --  3.7* 3.3*  --   --   --   --   --  3.9*  --   --   --   --   --   --   --   ALBUMIN  --   < > 1.6*  --   --   --  1.5*  --  1.5* 1.4*  --   --   --   --   --  1.4*  --   --   --   --   --   --   --   APTT 27  --  92*  --   --   --  32  --  31 34  34  --   --   --   --   --   --   --   --   --   --   --   --   --   INR 1.20  --  1.48  --   --   --  1.39  --  1.19 1.33  1.31  --   --   --  1.06  --   --   --   --   --   --   --   --   --   LATICACIDVEN  --   --  2.9*  --   --   --  2.6*  --   --  2.3*  --   --   --   --   --   --   --   --   --   --   --   --   --   TROPONINI  --   --   --   --   --   --  <0.30  --  <0.30  --   --   --   --   --   --   --   --   --   --   --   --   --   --   PROCALCITON  --   --   --   --   --   --   --   --   --  3.33  --   --   --   --   --  4.46  --   --  3.77  --   --   --   --   PHART  --   --   --   < > 7.469*  --  7.457*  --   --   --  7.429  --  7.385  --   --   --   --   --   --   --   --   --   --   PCO2ART  --   --   --   < > 30.7*  --  30.1*  --   --   --  36.2  --  40.8  --   --   --   --   --   --   --   --   --   --   PO2ART  --   --   --   < > 112.0*  --  127.0*  --   --   --  350.0*  --  183.0*  --   --   --   --   --   --   --   --   --   --   < > = values in this interval not displayed.  Recent Labs Lab 01/25/13 0407 01/25/13 0959 01/25/13 1154 01/25/13 1538 01/25/13 2024  GLUCAP 83 78 82 92 87    CXR: bibasilr effusion, ett wnl, line wnl  ASSESSMENT / PLAN:  PULMONARY A: Acute resp failure At risk TRALI P:   Last abg reviewed bacm  to or in am , remain intubated fo rthis Wean this am cpap5 ps 5, goa 2 hrs  Neg balance goals pcxr in am , re eval effusions  CARDIOVASCULAR A: Shock, d/t hemorrhage, now resolved.  Off all vasopressors  P:  Monitor tele Consider lasix  RENAL A: stable bmet  P:   kvo Lasix , hope to hemoconcentrate as well Dc bmet q8h  GASTROINTESTINAL A: Iliac artery bleed s/p hysterectomy.  S/p lap per surgery P:   ppi Per surgery to OR in Am  Avoid TPN  HEMATOLOGIC A:  S/p massive blood loss d/t iliac artery bleed P:  F/u cbc INFECTIOUS A:  Concern GYN infection amniotic P:   On zosyn,  maintain  ENDOCRINE A:  Glu controlled  P:   SSI  NEUROLOGIC A:  pain P:   Intermittent sedation protocol, WUA Upright when able  TODAY'S SUMMARY: Wean , no extubation planned, cbc in pm after lasix  I have personally obtained a history, examined the patient, evaluated laboratory and imaging results, formulated the assessment and plan and placed orders. CRITICAL CARE: The patient is critically ill with multiple organ systems failure and requires high complexity decision making for assessment and support, frequent evaluation and titration of therapies, application of advanced monitoring technologies and extensive interpretation of multiple databases. Critical Care Time devoted to patient care services described in this note is 30 minutes.   Mcarthur Rossetti. Tyson Alias, MD, FACP Pgr: [phone] [facility]

## 2013-01-26 NOTE — Transfer of Care (Signed)
Immediate Anesthesia Transfer of Care Note  Patient: Mackenzie [name]  Procedure(s) Performed: Procedure(s): EXPLORATORY LAPAROTOMY (N/A) ABDOMINAL VACUUM ASSISTED CLOSURE CHANGE (N/A)  Patient Location: ICU  Anesthesia Type:General  Level of Consciousness: sedated  Airway & Oxygen Therapy: Patient remains intubated per anesthesia plan and Patient placed on Ventilator (see vital sign flow sheet for setting)  Post-op Assessment: Report given to PACU RN and Post -op Vital signs reviewed and stable  Post vital signs: Reviewed and stable  Complications: No apparent anesthesia complications

## 2013-01-26 NOTE — Progress Notes (Signed)
Patient ID: Mackenzie [name], female   DOB: 10-31-1985, 27 y.o.   MRN: [id number] 1 Day Post-Op  Subjective: Pt awake on vent.  Not having any abdominal pain.  Objective: Vital signs in last 24 hours: Temp:  [99.3 F (37.4 C)-101.1 F (38.4 C)] 99.3 F (37.4 C) (05/27 0806) Pulse Rate:  [99-123] 99 (05/27 0821) Resp:  [11-23] 22 (05/27 0821) BP: (113-119)/(55-85) 119/72 mmHg (05/27 0821) SpO2:  [96 %-100 %] 99 % (05/27 0821) Arterial Line BP: (126)/(91) 126/91 mmHg (05/26 1000) FiO2 (%):  [40 %] 40 % (05/27 0821) Weight:  [154 lb 8.7 oz (70.1 kg)] 154 lb 8.7 oz (70.1 kg) (05/27 0600)    Intake/Output from previous day: 05/26 0701 - 05/27 0700 In: 1047.5 [I.V.:867.5; NG/GT:30; IV Piggyback:150] Out: 2335 [Urine:985; Emesis/NG output:350; Drains:1000] Intake/Output this shift:    PE: Abd: soft, wound VAC in place, just serosang output, no acute active bleeding, absent BS Ht: tachy  Lab Results:   Recent Labs  01/25/13 0952 01/26/13 0400  WBC 28.7* 22.4*  HGB 9.7* 7.3*  HCT 28.4* 21.7*  PLT 159 135*   BMET  Recent Labs  01/25/13 1847 01/26/13 0400  NA 138 139  K 4.5 4.0  CL 105 105  CO2 22 23  GLUCOSE 86 88  BUN 17 15  CREATININE 0.64 0.60  CALCIUM 7.6* 7.4*   PT/INR  Recent Labs  01/23/13 0952 01/23/13 2030  LABPROT 16.2*  16.0* 13.7  INR 1.33  1.31 1.06   CMP     Component Value Date/Time   NA 139 01/26/2013 0400   K 4.0 01/26/2013 0400   CL 105 01/26/2013 0400   CO2 23 01/26/2013 0400   GLUCOSE 88 01/26/2013 0400   BUN 15 01/26/2013 0400   CREATININE 0.60 01/26/2013 0400   CALCIUM 7.4* 01/26/2013 0400   PROT 3.9* 01/24/2013 0435   ALBUMIN 1.4* 01/24/2013 0435   AST 59* 01/24/2013 0435   ALT 41* 01/24/2013 0435   ALKPHOS 39 01/24/2013 0435   BILITOT 0.9 01/24/2013 0435   GFRNONAA >90 01/26/2013 0400   GFRAA >90 01/26/2013 0400   Lipase  No results found for this basename: lipase       Studies/Results: Dg Chest Port 1 View  01/26/2013    *RADIOLOGY REPORT*  Clinical Data: Respiratory failure, postop.  PORTABLE CHEST - 1 VIEW  Comparison: 01/25/2013  Findings: Endotracheal tube is 4 cm above the carina.  The left jugular central venous catheter tip is in the upper SVC region. Nasogastric tube extends into the abdomen.  Persistent basilar lung densities are suggestive for pleural fluid and atelectasis.  Heart size is stable.  Negative for a pneumothorax.  IMPRESSION: Stable basilar densities are suggestive for pleural effusions and atelectasis.  Support apparatuses as described.   Original Report Authenticated By: Richarda Overlie, M.D.   Dg Chest Port 1 View  01/25/2013   *RADIOLOGY REPORT*  Clinical Data: Endotracheal tube position.  PORTABLE CHEST - 1 VIEW  Comparison: 01/24/2013.  Findings: The endotracheal tube is in good position, unchanged. The left IJ catheter and NG tubes are stable.  The heart is mildly enlarged but unchanged.  Persistent bibasilar atelectasis or infiltrates and small effusions.  IMPRESSION:  1.  Stable support apparatus. 2.  Persistent effusions and bibasilar atelectasis or infiltrates.   Original Report Authenticated By: Rudie Meyer, M.D.   Dg Abd Portable 1v  01/25/2013   *RADIOLOGY REPORT*  Clinical Data: Abnormal sponge count during laparotomy.  History of a  ruptured uterus.  PORTABLE ABDOMEN - 1 VIEW  Comparison: None.  Findings: An NG tube is in the stomach.  The bowel is dilated and centralized.  No definite radiopaque sponges are identified in the abdomen. Coils are noted in the pelvis.  IMPRESSION: No radiopaque sponges are identified.   Original Report Authenticated By: Rudie Meyer, M.D.    Anti-infectives: Anti-infectives   Start     Dose/Rate Route Frequency Ordered Stop   01/23/13 1100  piperacillin-tazobactam (ZOSYN) IVPB 3.375 g     3.375 g 12.5 mL/hr over 240 Minutes Intravenous Every 8 hours 01/23/13 1031     01/22/13 1800  piperacillin-tazobactam (ZOSYN) IVPB 3.375 g  Status:  Discontinued      3.375 g 12.5 mL/hr over 240 Minutes Intravenous Every 8 hours 01/22/13 1709 01/23/13 1031   01/22/13 1200  gentamicin (GARAMYCIN) 140 mg in dextrose 5 % 50 mL IVPB  Status:  Discontinued     140 mg 107 mL/hr over 30 Minutes Intravenous Every 8 hours 01/22/13 0346 01/22/13 1709   01/22/13 0415  gentamicin (GARAMYCIN) 150 mg in dextrose 5 % 50 mL IVPB     150 mg 107.5 mL/hr over 30 Minutes Intravenous  Once 01/22/13 0343 01/22/13 0440   01/22/13 0345  ampicillin (OMNIPEN) 2 g in sodium chloride 0.9 % 50 mL IVPB  Status:  Discontinued     2 g 150 mL/hr over 20 Minutes Intravenous Every 6 hours 01/22/13 0325 01/22/13 1709       Assessment/Plan  1. S/p multiple abdominal surgeries for acute bleed, s/p embolization with coiling. 2. Open abdomen with wound VAC in place. 3. ABL anemia, hgb down some today, but no evidence of active bleeding right now from drains   Plan: 1. Will plan for OR tomorrow to try and closer her abdomen or at least wash her out. 2. Transfuse as needed. 3. D/w the husband who speaks some English and understood our plans.  LOS: 5 days    Daine Croker E 01/26/2013, 8:51 AM Pager: [phone]

## 2013-01-26 NOTE — Progress Notes (Addendum)
Post Partum Day 4  Subjective: Patient found intubated but very alert. Husband by her side.  No distress noted. Husband informed me that baby will be discharged to home today.  No obstetric concerns.  Scant bleeding reported.  Objective: Blood pressure 119/72, pulse 99, temperature 99.3 F (37.4 C), temperature source Oral, resp. rate 22, height 5\' 1"  (1.549 m), weight 154 lb 8.7 oz (70.1 kg), last menstrual period 03/22/2012, SpO2 99.00%.  Intake/Output     05/26 0701 - 05/27 0700 05/27 0701 - 05/28 0700   I.V. (mL/kg) 867.5 (12.4)    NG/GT 30    IV Piggyback 150    Total Intake(mL/kg) 1047.5 (14.9)    Urine (mL/kg/hr) 985 (0.6)    Emesis/NG output 350 (0.2)    Drains 1000 (0.6)    Total Output 2335     Net -1287.5           Physical Exam:  Gen: NAD Lungs: Bilateral basal rhochi Heart: RRR Abdomen: Soft, abdominal vac in place Incisions: Vertical incision with abdominal vac in place. Pfannenstiel incision healing well, no erythema, induration, staples in place.   DVT Evaluation: No cords or calf tenderness. Calf/Ankle edema is present. SCDs in place.  Recent Labs  01/25/13 0952 01/26/13 0400  HGB 9.7* 7.3*  HCT 28.4* 21.7*    Assessment/Plan: Postpartum day #4 s/p cesarean section with hysterectomy secondary to uterine rupture complicated by hemorrhagic shock and retroperitoneal hematoma s/p reexploration x 2 and packing and IR embolization of bleeding distal branch of the left internal iliac artery.  She underwent removal of packing on 01/25/13, abdominal closure attempt was not successful secondary to edema - Patient currently hemodynamically stable - The plan by the Surgery team to attempt abdominal closure on Wednesday  01/27/13 - Continue current care by SICU and Surgery Teams. Appreciate the ongoing care of our patient by the SICU and General Surgery teams.  We will continue to follow along and can be reached at any time for any obstetric concerns [facility] Attending  on call [phone]).   LOS: 5 days   Jaynie Collins, MD, FACOG Attending Obstetrician & Gynecologist Faculty Practice, [facility] of [place]

## 2013-01-27 ENCOUNTER — Inpatient Hospital Stay (HOSPITAL_COMMUNITY): Payer: Medicaid Other

## 2013-01-27 ENCOUNTER — Encounter (HOSPITAL_COMMUNITY)
Admission: RE | Disposition: A | Discharge status: Home / Self Care | Payer: Self-pay | Source: Ambulatory Visit | Attending: Critical Care Medicine

## 2013-01-27 ENCOUNTER — Encounter (HOSPITAL_COMMUNITY): Payer: Self-pay | Admitting: Anesthesiology

## 2013-01-27 ENCOUNTER — Inpatient Hospital Stay (HOSPITAL_COMMUNITY): Payer: Medicaid Other | Admitting: Anesthesiology

## 2013-01-27 ENCOUNTER — Encounter (HOSPITAL_COMMUNITY): Payer: Self-pay | Admitting: General Surgery

## 2013-01-27 DIAGNOSIS — Z609 Problem related to social environment, unspecified: Secondary | ICD-10-CM

## 2013-01-27 HISTORY — PX: VACUUM ASSISTED CLOSURE CHANGE: SHX5227

## 2013-01-27 LAB — GLUCOSE, CAPILLARY
Glucose-Capillary: 83 mg/dL (ref 70–99)
Glucose-Capillary: 72 mg/dL (ref 70–99)
Glucose-Capillary: 59 mg/dL — ABNORMAL LOW (ref 70–99)
Glucose-Capillary: 122 mg/dL — ABNORMAL HIGH (ref 70–99)
Glucose-Capillary: 96 mg/dL (ref 70–99)
Glucose-Capillary: 82 mg/dL (ref 70–99)
Glucose-Capillary: 79 mg/dL (ref 70–99)

## 2013-01-27 LAB — TYPE AND SCREEN
ABO/RH(D): A POS
Unit division: 0
Unit division: 0
Unit division: 0
Unit division: 0

## 2013-01-27 LAB — CBC WITH DIFFERENTIAL/PLATELET
Basophils Absolute: 0.2 10*3/uL — ABNORMAL HIGH (ref 0.0–0.1)
Lymphocytes Relative: 13 % (ref 12–46)
Monocytes Relative: 7 % (ref 3–12)
Platelets: 152 10*3/uL (ref 150–400)
RDW: 14.6 % (ref 11.5–15.5)
WBC: 20.4 10*3/uL — ABNORMAL HIGH (ref 4.0–10.5)

## 2013-01-27 LAB — COMPREHENSIVE METABOLIC PANEL
Alkaline Phosphatase: 49 U/L (ref 39–117)
BUN: 7 mg/dL (ref 6–23)
Chloride: 100 mEq/L (ref 96–112)
GFR calc Af Amer: >90 mL/min (ref >90)
Glucose, Bld: 266 mg/dL — ABNORMAL HIGH (ref 70–99)
Potassium: 3.4 mEq/L — ABNORMAL LOW (ref 3.5–5.1)
Total Bilirubin: 0.6 mg/dL (ref 0.3–1.2)
Total Protein: 4.1 g/dL — ABNORMAL LOW (ref 6.0–8.3)

## 2013-01-27 SURGERY — REPLACEMENT, WOUND VAC DRESSING, ABDOMEN
Anesthesia: General | Site: Abdomen | Wound class: Contaminated

## 2013-01-27 MED ORDER — ROCURONIUM BROMIDE 100 MG/10ML IV SOLN
INTRAVENOUS | Status: DC | PRN
  Administered 2013-01-27: 20 mg via INTRAVENOUS
  Administered 2013-01-27: 50 mg via INTRAVENOUS

## 2013-01-27 MED ORDER — POTASSIUM CHLORIDE 10 MEQ/50ML IV SOLN
10.0000 meq | INTRAVENOUS | Status: AC
  Administered 2013-01-27 (×4): 10 meq via INTRAVENOUS

## 2013-01-27 MED ORDER — DEXTROSE 250 MG/ML IV SOLN: 25.0000 g | Freq: Once | INTRAVENOUS | Status: DC

## 2013-01-27 MED ORDER — ACETAMINOPHEN 10 MG/ML IV SOLN
INTRAVENOUS | Status: DC | PRN
  Administered 2013-01-27: 1000 mg via INTRAVENOUS

## 2013-01-27 MED ORDER — BIOTENE DRY MOUTH MT LIQD: 15.0000 mL | Freq: Two times a day (BID) | OROMUCOSAL | Status: DC

## 2013-01-27 MED ORDER — FENTANYL CITRATE 0.05 MG/ML IJ SOLN
INTRAMUSCULAR | Status: DC | PRN
  Administered 2013-01-27: 100 ug via INTRAVENOUS

## 2013-01-27 MED ORDER — FENTANYL CITRATE 0.05 MG/ML IJ SOLN
50.0000 ug | INTRAMUSCULAR | Status: DC | PRN
  Administered 2013-01-27: 50 ug via INTRAVENOUS
  Administered 2013-01-27: 100 ug via INTRAVENOUS
  Administered 2013-01-28 (×2): 50 ug via INTRAVENOUS
  Administered 2013-01-28: 100 ug via INTRAVENOUS
  Administered 2013-01-28 – 2013-01-29 (×4): 50 ug via INTRAVENOUS
  Administered 2013-01-29 – 2013-01-30 (×2): 100 ug via INTRAVENOUS
  Filled 2013-01-27 (×11): qty 2

## 2013-01-27 MED ORDER — 0.9 % SODIUM CHLORIDE (POUR BTL) OPTIME
TOPICAL | Status: DC | PRN
  Administered 2013-01-27 (×4): 1000 mL

## 2013-01-27 MED ORDER — POTASSIUM CHLORIDE 10 MEQ/50ML IV SOLN
INTRAVENOUS | Status: AC
  Administered 2013-01-27: 10:00:00
  Filled 2013-01-27: qty 200

## 2013-01-27 MED ORDER — CHLORHEXIDINE GLUCONATE 0.12 % MT SOLN
15.0000 mL | Freq: Two times a day (BID) | OROMUCOSAL | Status: DC
  Administered 2013-01-27 – 2013-01-31 (×5): 15 mL via OROMUCOSAL
  Filled 2013-01-27 (×5): qty 15

## 2013-01-27 MED ORDER — LACTATED RINGERS IV SOLN
INTRAVENOUS | Status: DC | PRN
  Administered 2013-01-27: 10:00:00 via INTRAVENOUS

## 2013-01-27 MED ORDER — ACETAMINOPHEN 160 MG/5ML PO SOLN
650.0000 mg | ORAL | Status: DC | PRN
  Administered 2013-01-27: 650 mg via ORAL
  Filled 2013-01-27: qty 20.3

## 2013-01-27 MED ORDER — MIDAZOLAM HCL 5 MG/5ML IJ SOLN
INTRAMUSCULAR | Status: DC | PRN
  Administered 2013-01-27: 4 mg via INTRAVENOUS

## 2013-01-27 MED ORDER — DEXTROSE 50 % IV SOLN
INTRAVENOUS | Status: AC
  Administered 2013-01-27: 50 mL
  Filled 2013-01-27: qty 50

## 2013-01-27 SURGICAL SUPPLY — 49 items
BLADE SURG ROTATE 9660 (MISCELLANEOUS) IMPLANT
CANISTER SUCTION 2500CC (MISCELLANEOUS) ×2 IMPLANT
CHLORAPREP W/TINT 26ML (MISCELLANEOUS) ×2 IMPLANT
CLOTH BEACON ORANGE TIMEOUT ST (SAFETY) ×2 IMPLANT
COVER SURGICAL LIGHT HANDLE (MISCELLANEOUS) ×2 IMPLANT
DRAPE LAPAROSCOPIC ABDOMINAL (DRAPES) ×2 IMPLANT
DRAPE UTILITY 15X26 W/TAPE STR (DRAPE) ×4 IMPLANT
DRAPE WARM FLUID 44X44 (DRAPE) ×2 IMPLANT
DRSG PAD ABDOMINAL 8X10 ST (GAUZE/BANDAGES/DRESSINGS) ×2 IMPLANT
ELECT BLADE 6.5 EXT (BLADE) IMPLANT
ELECT CAUTERY BLADE 6.4 (BLADE) ×2 IMPLANT
ELECT REM PT RETURN 9FT ADLT (ELECTROSURGICAL) ×2
ELECTRODE REM PT RTRN 9FT ADLT (ELECTROSURGICAL) ×1 IMPLANT
GLOVE BIO SURGEON STRL SZ7 (GLOVE) ×2 IMPLANT
GLOVE BIO SURGEON STRL SZ7.5 (GLOVE) ×2 IMPLANT
GLOVE BIOGEL PI IND STRL 6.5 (GLOVE) ×1 IMPLANT
GLOVE BIOGEL PI IND STRL 7.0 (GLOVE) ×1 IMPLANT
GLOVE BIOGEL PI IND STRL 7.5 (GLOVE) ×1 IMPLANT
GLOVE BIOGEL PI IND STRL 8 (GLOVE) ×1 IMPLANT
GLOVE BIOGEL PI INDICATOR 6.5 (GLOVE) ×1
GLOVE BIOGEL PI INDICATOR 7.0 (GLOVE) ×1
GLOVE BIOGEL PI INDICATOR 7.5 (GLOVE) ×1
GLOVE BIOGEL PI INDICATOR 8 (GLOVE) ×1
GLOVE SKINSENSE NS SZ6.5 (GLOVE) ×1
GLOVE SKINSENSE STRL SZ6.5 (GLOVE) ×1 IMPLANT
GOWN STRL NON-REIN LRG LVL3 (GOWN DISPOSABLE) ×4 IMPLANT
GOWN STRL REIN XL XLG (GOWN DISPOSABLE) ×2 IMPLANT
KIT BASIN OR (CUSTOM PROCEDURE TRAY) ×2 IMPLANT
KIT ROOM TURNOVER OR (KITS) ×2 IMPLANT
LIGASURE IMPACT 36 18CM CVD LR (INSTRUMENTS) IMPLANT
NS IRRIG 1000ML POUR BTL (IV SOLUTION) ×4 IMPLANT
PACK GENERAL/GYN (CUSTOM PROCEDURE TRAY) ×2 IMPLANT
PAD ARMBOARD 7.5X6 YLW CONV (MISCELLANEOUS) ×4 IMPLANT
SPECIMEN JAR LARGE (MISCELLANEOUS) IMPLANT
SPONGE GAUZE 4X4 12PLY (GAUZE/BANDAGES/DRESSINGS) ×2 IMPLANT
SPONGE LAP 18X18 X RAY DECT (DISPOSABLE) ×4 IMPLANT
STAPLER VISISTAT 35W (STAPLE) ×2 IMPLANT
SUCTION POOLE TIP (SUCTIONS) ×2 IMPLANT
SUT PDS AB 1 TP1 96 (SUTURE) IMPLANT
SUT PDS II 0 TP-1 LOOPED 60 (SUTURE) ×4 IMPLANT
SUT SILK 2 0 SH CR/8 (SUTURE) ×2 IMPLANT
SUT SILK 2 0 TIES 10X30 (SUTURE) ×2 IMPLANT
SUT SILK 3 0 SH CR/8 (SUTURE) IMPLANT
SUT SILK 3 0 TIES 10X30 (SUTURE) IMPLANT
TAPE CLOTH SURG 4X10 WHT LF (GAUZE/BANDAGES/DRESSINGS) ×2 IMPLANT
TOWEL OR 17X26 10 PK STRL BLUE (TOWEL DISPOSABLE) ×2 IMPLANT
TRAY FOLEY CATH 14FRSI W/METER (CATHETERS) IMPLANT
WATER STERILE IRR 1000ML POUR (IV SOLUTION) IMPLANT
YANKAUER SUCT BULB TIP NO VENT (SUCTIONS) IMPLANT

## 2013-01-27 NOTE — Progress Notes (Signed)
Post Partum Day 5  Subjective: Patient underwent surgery this morning for evacuation of retroperitoneal hematoma, removal of packing and abdominal wall closure.  No complications.  Patient was extubated after the surgery.  Husband by her side.  No distress noted. Husband informed me that baby is doing well.  No obstetric concerns.    Objective: Blood pressure 107/78, pulse 98, temperature 100.1 F (37.8 C), temperature source Oral, resp. rate 17, height 5\' 1"  (1.549 m), weight 149 lb 14.6 oz (68 kg), last menstrual period 03/22/2012, SpO2 100.00%.  Intake/Output     05/27 0701 - 05/28 0700 05/28 0701 - 05/29 0700   I.V. (mL/kg) 950.2 (14) 817.7 (12)   Blood  362.5   NG/GT  75   IV Piggyback 150 150   Total Intake(mL/kg) 1100.2 (16.2) 1405.2 (20.7)   Urine (mL/kg/hr) 1925 (1.2) 645 (0.9)   Emesis/NG output 275 (0.2)    Drains 750 (0.5)    Blood  50 (0.1)   Total Output 2950 695   Net -1849.8 +710.2         Physical Exam:  Gen: NAD Abdomen: Soft, mildly distended, dressing in place Incisions: Vertical incision with dressing in place. Pfannenstiel incision healing well, no erythema, induration, staples in place.   DVT Evaluation: No cords or calf tenderness.  SCDs in place.  Recent Labs  01/26/13 2000 01/27/13 0428  HGB 7.0* 6.7*  HCT 20.8* 20.4*  Patient received one unit pRBCs today.   Assessment/Plan: Postpartum day #5 s/p cesarean section with hysterectomy secondary to uterine rupture complicated by hemorrhagic shock and retroperitoneal hematoma s/p reexploration x 2 and packing and IR embolization of bleeding distal branch of the left internal iliac artery.  She underwent removal of packing on 01/25/13, and evacuation of retroperitoneal hematoma and abdominal wall closure on 01/27/13. Patient is extubated and currently hemodynamically stable with no obstetric concerns. - Continue current care by SICU and Surgery Teams.   We appreciate the ongoing care of our patient by the  SICU and General Surgery teams.  We will continue to follow along and can be reached at any time for any obstetric concerns [facility] Attending on call [phone]).   LOS: 6 days   Jaynie Collins, MD, FACOG Attending Obstetrician & Gynecologist Faculty Practice, [facility] of [place]

## 2013-01-27 NOTE — Progress Notes (Signed)
01/27/13 0430  Hypoglycemic Event  CBG: 59  Treatment:  1 Amp of D50 - pt NPO, post GI surgery   Symptoms:  None   Follow-up CBG Result: 122  Possible Reasons for Event:  Pt NPO. Not on tube feeds, MD avoiding TPN   Comments/MD notified: Protocol followed   Mackenzie [name] P  Remember to initiate Hypoglycemia Order Set & complete

## 2013-01-27 NOTE — Op Note (Signed)
Pre Operative Diagnosis:  Open abdomen and vac  Post Operative Diagnosis: same, retoperitoneal hematoma  Procedure: Vac and sponge removal x 1, abd washout and evacuation of retroperitoneal hematoma, abd wall closure  Surgeon: Dr. Axel Filler  Assistant: Dr. Dwain Sarna  Anesthesia: GETA  EBL: 25cc  Complications: none  Counts: reported as correct x 2  Findings:  The pt had retroperiotneal hematoma that was clotted and that was evacuated.  Her abdominal wall was closed with no tension.  There was no active intraperitoneal bleeding on exploration.  Indications for procedure:  The patient is a 27 year old female status post C-section with retroperitoneal hematoma. Patient previously had undergone a colon position of the internal iliac branch. The patient was in the ICU represented on exposure laparotomy with VAC placement x2. The patient was brought back for removal of vac and abdominal washout and possible abdominal wall closure.  Details of the procedure:The patient was taken back to the operating room. The patient was placed in supine position with bilateral SCDs in place. After appropriate anitbiotics were confirmed, a time-out was confirmed and all facts were verified.  We initiated the case by removing the Vac-Pac from all surrounding tissue.  Upon exploring the abdomen it was noticed there was a left-sided retroperitoneal hematoma that previously been opened up in the initial exploratory laparotomy. We evacuated this hematoma which consisted of clotted blood. There is no active exsanguination. We washed out her abdomen well for all 4 quadrants. There is no active bleeding intraperitoneally. We tested the closure of the abdominal wall were reapproximated with 2 Kocher clamps.  Due the patient's abdominal wall laxity to reapproximate the abdominal wall using #1 PDS stitches in a running fashion. The patient's peak pressures were unchanged at the closure of the abdominal wall. The skin was  then closed with interrupted staples dressed with 4x4, AB pad and tape.The patient was taken to the ICU in stable condition.

## 2013-01-27 NOTE — Progress Notes (Signed)
eLink Physician-Brief Progress Note Patient Name: Mackenzie [name] DOB: [birth year] MRN: [id number]  Date of Service  01/27/2013   HPI/Events of Note  Hgb down to 6.7 from 7.0   eICU Interventions  Plan: Transfuse 1 unit of pRBC   Intervention Category Intermediate Interventions: Bleeding - evaluation and treatment with blood products  Jaquilla Woodroof 01/27/2013, 5:20 AM

## 2013-01-27 NOTE — Anesthesia Preprocedure Evaluation (Addendum)
Anesthesia Evaluation  Patient identified by MRN, date of birth, ID band Patient awake    Reviewed: Allergy & Precautions, H&P , NPO status , Patient's Chart, lab work & pertinent test results  History of Anesthesia Complications Negative for: history of anesthetic complications  Airway Mallampati: I TM Distance: >3 FB Neck ROM: Full    Dental  (+) Teeth Intact and Dental Advisory Given   Pulmonary  Intubated r/t resp failure s/p emergency C/S, retroperitoneal bleed  breath sounds clear to auscultation ETT in situ        Cardiovascular negative cardio ROS  Rate:Tachycardia     Neuro/Psych negative neurological ROS  negative psych ROS   GI/Hepatic Neg liver ROS, 5/23- uterine rupture, massive blood loss and prbc Tx, shock   5/24- retroperitoneal bleed significant   5-24 to OR for wound vac   5-24 to IR for embolization of left iliac artery branch   Endo/Other    Renal/GU negative Renal ROS   5/23- uterine rupture, massive blood loss and prbc Tx, shock   5/24- retroperitoneal bleed significant   5-24 to OR for wound vac   5-24 to IR for embolization of left iliac artery branch    Musculoskeletal negative musculoskeletal ROS (+)   Abdominal   Peds negative pediatric ROS (+)  Hematology  (+) Blood dyscrasia, anemia ,   Anesthesia Other Findings   Reproductive/Obstetrics                        Anesthesia Physical Anesthesia Plan  ASA: III  Anesthesia Plan: General   Post-op Pain Management:    Induction: Intravenous  Airway Management Planned:   Additional Equipment:   Intra-op Plan:   Post-operative Plan: Extubation in OR  Informed Consent: I have reviewed the patients History and Physical, chart, labs and discussed the procedure including the risks, benefits and alternatives for the proposed anesthesia with the patient or authorized representative who has indicated his/her  understanding and acceptance.   Dental advisory given  Plan Discussed with: CRNA and Surgeon  Anesthesia Plan Comments:         Anesthesia Quick Evaluation

## 2013-01-27 NOTE — Progress Notes (Signed)
eLink Nursing ICU Electrolyte Replacement Protocol  Patient Name: Mackenzie [name] DOB: November 16, 1985 MRN: [id number]  Date of Service  01/27/2013 K+ 3.4 Electrolyte protocol criteria met. Lab replaced per protocol. MD notified   HPI/Events of Note    Recent Labs Lab 01/22/13 1210 01/22/13 1745  01/25/13 0352 01/25/13 1047 01/25/13 1847 01/26/13 0400 01/27/13 0428  NA 135 133*  < > 135 137 138 139 136  K 3.9 3.9  < > 4.7 4.8 4.5 4.0 3.4*  CL 103 102  < > 102 106 105 105 100  CO2 24 23  < > 23 21 22 23 24   GLUCOSE 104* 148*  < > 82 84 86 88 266*  BUN 8 12  < > 17 18 17 15 7   CREATININE 0.84 0.77  < > 0.61 0.66 0.64 0.60 0.48*  CALCIUM 7.8* 7.4*  < > 7.8* 7.3* 7.6* 7.4* 7.1*  MG  --  1.1*  --   --   --   --   --   --   PHOS  --  5.0*  --   --   --   --   --   --   < > = values in this interval not displayed.  Estimated Creatinine Clearance: 93.2 ml/min (by C-G formula based on Cr of 0.48).  Intake/Output     05/27 0701 - 05/28 0700   I.V. (mL/kg) 870.2 (12.8)   IV Piggyback 137.5   Total Intake(mL/kg) 1007.7 (14.8)   Urine (mL/kg/hr) 1805 (1.1)   Drains 750 (0.5)   Total Output 2555   Net -1547.3        - I/O DETAILED x24h    Total I/O In: 437.5 [I.V.:400; IV Piggyback:37.5] Out: 1610 [Urine:1210; Drains:400] - I/O THIS SHIFT    ASSESSMENT   eICURN Interventions     ASSESSMENT: MAJOR ELECTROLYTE    Mackenzie [name] 01/27/2013, 6:47 AM

## 2013-01-27 NOTE — Anesthesia Postprocedure Evaluation (Signed)
  Anesthesia Post-op Note  Patient: Mackenzie [name]  Procedure(s) Performed: Procedure(s): Abdominal Washout and Abdominal Closure with Removal Wound Vac (N/A)  Patient Location: SICU  Anesthesia Type:General  Level of Consciousness: sedated and Patient remains intubated per anesthesia plan  Airway and Oxygen Therapy: Patient Spontanous Breathing  Post-op Pain: none  Post-op Assessment: Post-op Vital signs reviewed  Post-op Vital Signs: stable  Complications: No apparent anesthesia complications

## 2013-01-27 NOTE — Transfer of Care (Signed)
Immediate Anesthesia Transfer of Care Note  Patient: Mackenzie [name]  Procedure(s) Performed: Procedure(s): Abdominal Washout and Abdominal Closure with Removal Wound Vac (N/A)  Patient Location: PACU  Anesthesia Type:General  Level of Consciousness: sedated, unresponsive and Patient remains intubated per anesthesia plan  Airway & Oxygen Therapy: Patient remains intubated per anesthesia plan and Patient placed on Ventilator (see vital sign flow sheet for setting)  Post-op Assessment: Report given to PACU RN and Post -op Vital signs reviewed and stable  Post vital signs: Reviewed and stable  Complications: No apparent anesthesia complications

## 2013-01-27 NOTE — OR Nursing (Signed)
Lower back dressing dry and intact preop and postop

## 2013-01-27 NOTE — Progress Notes (Signed)
01/27/13 0515  CRITICAL VALUE ALERT  Critical value received:  Hgb - 6.7  Date of notification:  01/27/13  Time of notification:  0515  Critical value read back: YES  Nurse who received alert:  Elisha Headland RN   MD notified (1st page):  Deterding  I: MD will look at pt chart and write orders.    01/27/13 0620  MD note written to transfuse 1 unit of PRBCs. 1 unit of PRBCs ordered per eLink MD note.   Elisha Headland RN

## 2013-01-27 NOTE — Progress Notes (Signed)
PULMONARY  / CRITICAL CARE MEDICINE  Name: Mackenzie [name] MRN: [id number] DOB: March 25, 1986    ADMISSION DATE:  01/21/2013 CONSULTATION DATE:  5/23  REFERRING MD :  Dr Jolayne Panther PRIMARY SERVICE: OB   CHIEF COMPLAINT:  shock  BRIEF PATIENT DESCRIPTION: 27 yr old 22 weeks pregant, STAT Csection after failed labor, uterine rupture, shock post op  SIGNIFICANT EVENTS / STUDIES:  5/23- uterine rupture, massive blood loss and prbc Tx, shock  5/24- retroperitoneal bleed significant  5-24 to OR for wound vac  5-24 to IR for embolization of left iliac artery branch 5/28- plan back to OR  LINES / TUBES: Left ij 5/23>>> 5/24 ETT>>>  CULTURES: [facility] 5/24 resp cult 5/24>>>NF  ANTIBIOTICS: Zosyn 5/23 (concern infected amniotic)>>>  SUBJECTIVE:  Weaning this am, for one unit pRBC  VITAL SIGNS: Temp:  [99.5 F (37.5 C)-102.9 F (39.4 C)] 101.1 F (38.4 C) (05/28 0850) Pulse Rate:  [89-111] 104 (05/28 0841) Resp:  [12-26] 17 (05/28 0841) BP: (105-125)/(56-91) 110/72 mmHg (05/28 0850) SpO2:  [97 %-100 %] 97 % (05/28 0841) FiO2 (%):  [30 %-40 %] 30 % (05/28 0841) Weight:  [149 lb 14.6 oz (68 kg)] 149 lb 14.6 oz (68 kg) (05/28 0400) HEMODYNAMICS: CVP:  [8 mmHg] 8 mmHg VENTILATOR SETTINGS: Vent Mode:  [-] PSV FiO2 (%):  [30 %-40 %] 30 % Set Rate:  [14 bmp] 14 bmp Vt Set:  [350 mL] 350 mL PEEP:  [5 cmH20] 5 cmH20 Pressure Support:  [5 cmH20-10 cmH20] 5 cmH20 INTAKE / OUTPUT: Intake/Output     05/27 0701 - 05/28 0700 05/28 0701 - 05/29 0700   I.V. (mL/kg) 910.2 (13.4) 250 (3.7)   Blood  12.5   NG/GT     IV Piggyback 150    Total Intake(mL/kg) 1060.2 (15.6) 262.5 (3.9)   Urine (mL/kg/hr) 1925 (1.2)    Emesis/NG output 275 (0.2)    Drains 750 (0.5)    Blood     Total Output 2950     Net -1889.8 +262.5          PHYSICAL EXAMINATION: General:  Vented, awake, follows commands, calm Neuro:  nonfocal exam, follows commands HEENT:  jvd increased Cardiovascular:  s1 s2 rrt  disant Lungs: coarse diffuse mild Abdomen:  Distended abdo, wound vac in place, open Skin:  No rash  LABS:  Recent Labs Lab 01/22/13 1210  01/22/13 1745  01/22/13 1854  01/22/13 2355  01/23/13 0515 01/23/13 0952 01/23/13 1039 01/23/13 1247  01/23/13 2029 01/23/13 2030  01/24/13 0435  01/25/13 0433  01/25/13 1847 01/26/13 0400 01/26/13 2000 01/27/13 0428  HGB  --   < > 7.4*  --   --   < >  --   < > 8.4* 6.3*  --  3.4*  < >  --  9.9*  < > 9.0*  < >  --   < >  --  7.3* 7.0* 6.7*  WBC  --   < > 17.7*  --   --   < >  --   < > 16.5* 14.5*  --   --   < >  --  12.1*  < > 15.7*  < >  --   < >  --  22.4* 21.6* 20.4*  PLT 114*  < > 124*  124*  --   --   < >  --   < > 64*  67* 70*  PLATELETS APPEAR ADEQUATE  --   --   < >  --  105*  < > 91*  < >  --   < >  --  135* 143* 152  NA 135  --  133*  --   --   < > 131*  --  132* 134*  --  137  --   --  131*  --  134*  < >  --   < > 138 139  --  136  K 3.9  --  3.9  --   --   < > 4.0  --  4.1 4.3  --  4.1  --   --  4.5  --  4.5  < >  --   < > 4.5 4.0  --  3.4*  CL 103  --  102  --   --   < > 100  --  102 104  --   --   --   --  101  --  105  < >  --   < > 105 105  --  100  CO2 24  --  23  --   --   < > 22  --  24 21  --   --   --   --  23  --  20  < >  --   < > 22 23  --  24  GLUCOSE 104*  --  148*  --   --   < > 162*  --  111* 134*  --   --   --   --  122*  --  100*  < >  --   < > 86 88  --  266*  BUN 8  --  12  --   --   < > 16  --  17 19  --   --   --   --  17  --  20  < >  --   < > 17 15  --  7  CREATININE 0.84  --  0.77  --   --   < > 0.87  --  0.84 0.88  --   --   --   --  0.66  --  0.77  < >  --   < > 0.64 0.60  --  0.48*  CALCIUM 7.8*  --  7.4*  --   --   < > 7.1*  --  7.4* 6.6*  --   --   --   --  7.0*  --  6.8*  < >  --   < > 7.6* 7.4*  --  7.1*  MG  --   --  1.1*  --   --   --   --   --   --   --   --   --   --   --   --   --   --   --   --   --   --   --   --   --   PHOS  --   --  5.0*  --   --   --   --   --   --   --   --   --   --    --   --   --   --   --   --   --   --   --   --   --   AST  --   < >  18  --   --   --  22  --  21 19  --   --   --   --   --   --  59*  --   --   --   --   --   --  31  ALT  --   < > 9  --   --   --  9  --  9 8  --   --   --   --   --   --  41*  --   --   --   --   --   --  27  ALKPHOS  --   < > 43  --   --   --  44  --  44 36*  --   --   --   --   --   --  39  --   --   --   --   --   --  49  BILITOT  --   < > 3.1*  --   --   --  1.6*  --  1.3* 0.9  --   --   --   --   --   --  0.9  --   --   --   --   --   --  0.6  PROT  --   < > 3.4*  --   --   --  3.5*  --  3.7* 3.3*  --   --   --   --   --   --  3.9*  --   --   --   --   --   --  4.1*  ALBUMIN  --   < > 1.6*  --   --   --  1.5*  --  1.5* 1.4*  --   --   --   --   --   --  1.4*  --   --   --   --   --   --  1.3*  APTT 27  --  92*  --   --   --  32  --  31 34  34  --   --   --   --   --   --   --   --   --   --   --   --   --   --   INR 1.20  --  1.48  --   --   --  1.39  --  1.19 1.33  1.31  --   --   --   --  1.06  --   --   --   --   --   --   --   --   --   LATICACIDVEN  --   --  2.9*  --   --   --  2.6*  --   --  2.3*  --   --   --   --   --   --   --   --   --   --   --   --   --   --   TROPONINI  --   --   --   --   --   --  <0.30  --  <0.30  --   --   --   --   --   --   --   --   --   --   --   --   --   --   --  PROCALCITON  --   --   --   --   --   --   --   --   --  3.33  --   --   --   --   --   --  4.46  --  3.77  --   --   --   --   --   PHART  --   --   --   < > 7.469*  --  7.457*  --   --   --  7.429 7.378  --  7.385  --   --   --   --   --   --   --   --   --   --   PCO2ART  --   --   --   < > 30.7*  --  30.1*  --   --   --  36.2 39.9  --  40.8  --   --   --   --   --   --   --   --   --   --   PO2ART  --   --   --   < > 112.0*  --  127.0*  --   --   --  350.0* 317.0*  --  183.0*  --   --   --   --   --   --   --   --   --   --   < > = values in this interval not displayed.  Recent Labs Lab 01/26/13 1146 01/26/13 1553  01/26/13 2110 01/27/13 0535 01/27/13 0758  GLUCAP 83 76 72 122* 80    CXR: bibasilr effusion, ett wnl, line wnl  ASSESSMENT / PLAN:  PULMONARY A: Acute resp failure At risk TRALI (not noted), edema / effusions bibasilr P:   Lasix to neg balance successful Wean this am cpap5 ps 24m, goal 30 min , assess rsbi Post closure, goal to rewean and extubate Will need IS  CARDIOVASCULAR A: Shock, d/t hemorrhage, now resolved.  Off all vasopressors  P:  Monitor tele Continue lasix  RENAL A:Mild hypokalemia, neg balance P:   Replace K  Continue lasix kvo  GASTROINTESTINAL A: Iliac artery bleed s/p hysterectomy.  S/p lap per surgery P:   ppi Per surgery to OR in Am  Avoid TPN, hope to feed post closure   HEMATOLOGIC A:  S/p massive blood loss d/t iliac artery bleed, hemoconcentration of plat P:  F/u cbc with further neg balance One unit prbc to be given with hgb less 7  INFECTIOUS A:  Concern GYN infection amniotic? Now hysterectomy, open abdo, leukocytosis common in retroper bleed P:   On zosyn,  Maintain but I will call GYN and if not needed per there needs will dc this  ENDOCRINE A:  Glu controlled  P:   SSI  NEUROLOGIC A:  pain P:   WUA, goal dc fent drip, post closure Upright when able  TODAY'S SUMMARY: Wean now and post op , consider extubation , lasix maintain, dc zosyn as goal in next 24 hrs  I have personally obtained a history, examined the patient, evaluated laboratory and imaging results, formulated the assessment and plan and placed orders. CRITICAL CARE: The patient is critically ill with multiple organ systems failure and requires high complexity decision making for assessment and support, frequent evaluation and titration of therapies, application of advanced monitoring technologies and extensive interpretation of multiple  databases. Critical Care Time devoted to patient care services described in this note is 30 minutes.   Mcarthur Rossetti. Tyson Alias,  MD, FACP Pgr: [phone] [facility]

## 2013-01-27 NOTE — Procedures (Signed)
Extubation Procedure Note  Patient Details:   Name: Mackenzie [name] DOB: [birth year] MRN: [id number]   Airway Documentation:     Evaluation  O2 sats: stable throughout Complications: No apparent complications Patient did tolerate procedure well. Bilateral Breath Sounds: Rhonchi Suctioning: Airway;Oral Yes  Order to extubate, positive for cuff leak, extubated to 3lpm [name]. No stridor or dyspnea noted after extubation. Pt resting comfortably at this time. All vitals are within normal limits. RT will continue to monitor.   Beatris Si 01/27/2013, 3:04 PM

## 2013-01-27 NOTE — Progress Notes (Signed)
Mild temp up by 0.6 degrees, no rash, no back pain, no urine changes Had temp last night as well No evidence blood Tx rxn  Mcarthur Rossetti. Tyson Alias, MD, FACP Pgr: [phone] [facility]

## 2013-01-27 NOTE — Progress Notes (Signed)
Pt has rash on abdomen and back. Husband states rash was present prior to admission. Pt denies pain or itching related to rash. Nelva Bush, NP notified. Will continue to monitor.

## 2013-01-28 ENCOUNTER — Inpatient Hospital Stay (HOSPITAL_COMMUNITY): Payer: Medicaid Other

## 2013-01-28 ENCOUNTER — Encounter (HOSPITAL_COMMUNITY): Payer: Self-pay

## 2013-01-28 LAB — BASIC METABOLIC PANEL
Calcium: 7.6 mg/dL — ABNORMAL LOW (ref 8.4–10.5)
GFR calc Af Amer: >90 mL/min (ref >90)
GFR calc non Af Amer: >90 mL/min (ref >90)
Sodium: 135 mEq/L (ref 135–145)

## 2013-01-28 LAB — CBC
HCT: 25.5 % — ABNORMAL LOW (ref 36.0–46.0)
Hemoglobin: 8.6 g/dL — ABNORMAL LOW (ref 12.0–15.0)
MCHC: 33.7 g/dL (ref 30.0–36.0)
RBC: 2.9 MIL/uL — ABNORMAL LOW (ref 3.87–5.11)

## 2013-01-28 LAB — GLUCOSE, CAPILLARY
Glucose-Capillary: 108 mg/dL — ABNORMAL HIGH (ref 70–99)
Glucose-Capillary: 115 mg/dL — ABNORMAL HIGH (ref 70–99)
Glucose-Capillary: 78 mg/dL (ref 70–99)
Glucose-Capillary: 80 mg/dL (ref 70–99)
Glucose-Capillary: 93 mg/dL (ref 70–99)

## 2013-01-28 LAB — PHOSPHORUS: Phosphorus: 2.5 mg/dL (ref 2.3–4.6)

## 2013-01-28 LAB — MAGNESIUM
Magnesium: 1.4 mg/dL — ABNORMAL LOW (ref 1.5–2.5)
Magnesium: 2.2 mg/dL (ref 1.5–2.5)

## 2013-01-28 MED ORDER — POTASSIUM PHOSPHATE DIBASIC 3 MMOLE/ML IV SOLN
30.0000 mmol | Freq: Once | INTRAVENOUS | Status: AC
  Administered 2013-01-28: 30 mmol via INTRAVENOUS
  Filled 2013-01-28: qty 10

## 2013-01-28 MED ORDER — SODIUM CHLORIDE 0.9 % IV SOLN
6.0000 g | Freq: Once | INTRAVENOUS | Status: AC
  Administered 2013-01-28: 6 g via INTRAVENOUS
  Filled 2013-01-28: qty 12

## 2013-01-28 MED ORDER — POTASSIUM CHLORIDE 20 MEQ/15ML (10%) PO LIQD
20.0000 meq | ORAL | Status: AC
  Administered 2013-01-28 (×2): 20 meq
  Filled 2013-01-28 (×2): qty 15

## 2013-01-28 MED ORDER — MAGNESIUM SULFATE 40 MG/ML IJ SOLN
2.0000 g | Freq: Once | INTRAMUSCULAR | Status: AC
  Administered 2013-01-28: 2 g via INTRAVENOUS
  Filled 2013-01-28: qty 50

## 2013-01-28 MED ORDER — FUROSEMIDE 10 MG/ML IJ SOLN
INTRAMUSCULAR | Status: AC
  Filled 2013-01-28: qty 4

## 2013-01-28 MED ORDER — FUROSEMIDE 10 MG/ML IJ SOLN
20.0000 mg | Freq: Three times a day (TID) | INTRAMUSCULAR | Status: AC
  Administered 2013-01-28 (×2): 20 mg via INTRAVENOUS
  Filled 2013-01-28 (×2): qty 2

## 2013-01-28 NOTE — Progress Notes (Signed)
[facility] Faculty Practice OB/GYN Attending Progress Note  Post Partum Day 6  Subjective: Patient is resting in bed, reports incisional pain.  No flatus yet. Husband by her side.  No distress noted. Husband informed me that baby is doing well.  No obstetric concerns.    Objective: Blood pressure 114/73, pulse 95, temperature 99.9 F (37.7 C), temperature source Oral, resp. rate 22, height 5\' 1"  (1.549 m), weight 149 lb 14.6 oz (68 kg), last menstrual period 03/22/2012, SpO2 97.00%.  Intake/Output     05/28 0701 - 05/29 0700 05/29 0701 - 05/30 0700   I.V. (mL/kg) 1057.7 (15.6)    Blood 362.5    NG/GT 105    IV Piggyback 150    Total Intake(mL/kg) 1675.2 (24.6)    Urine (mL/kg/hr) 1580 (1)    Emesis/NG output     Drains     Blood 50 (0)    Total Output 1630     Net +45.2           Physical Exam:  Gen: NAD Abdomen: Soft, mildly distended, scant BS, dressing in place Incisions: Vertical incision with dressing in place. Pfannenstiel incision healing well, no erythema, induration, staples in place.   DVT Evaluation: No cords or calf tenderness.  SCDs in place.  Recent Labs  01/27/13 0428 01/28/13 0430  HGB 6.7* 8.6*  HCT 20.4* 25.5*  Patient received one unit pRBCs ton 01/27/13.   Assessment/Plan: Postpartum day #6 s/p cesarean section with hysterectomy secondary to uterine rupture complicated by hemorrhagic shock and retroperitoneal hematoma s/p reexploration x 2 and packing and IR embolization of bleeding distal branch of the left internal iliac artery.  She underwent removal of packing on 01/25/13, and evacuation of retroperitoneal hematoma and abdominal wall closure on 01/27/13. Patient is extubated and currently hemodynamically stable with no obstetric concerns. - Pfannenstiel incisional staples can be removed, this was communicated to the RN.  Steristrips can be placed if needed. Routine wound care. - Continue current care by SICU and Surgery Teams.  We appreciate the ongoing  care of our patient by the SICU and General Surgery teams.  We will continue to follow along and can be reached at any time for any obstetric concerns [facility] Attending on call [phone]).   LOS: 7 days   Jaynie Collins, MD, FACOG Attending Obstetrician & Gynecologist Faculty Practice, [facility]

## 2013-01-28 NOTE — Progress Notes (Signed)
1 Day Post-Op  Subjective: PT doing well.  Extubated yesterday.  NGT clamped.  No flatus or BM   Objective: Vital signs in last 24 hours: Temp:  [98.6 F (37 C)-102.7 F (39.3 C)] 99.9 F (37.7 C) (05/29 0354) Pulse Rate:  [87-119] 95 (05/29 0630) Resp:  [11-24] 22 (05/29 0630) BP: (88-129)/(50-96) 114/73 mmHg (05/29 0630) SpO2:  [96 %-100 %] 97 % (05/29 0630) FiO2 (%):  [30 %-40 %] 40 % (05/28 1400) Last BM Date:  (pts)  Intake/Output from previous day: 05/28 0701 - 05/29 0700 In: 1675.2 [I.V.:1057.7; Blood:362.5; NG/GT:105; IV Piggyback:150] Out: 1630 [Urine:1580; Blood:50] Intake/Output this shift:    General appearance: alert and cooperative Cardio: regular rate and rhythm, S1, S2 normal, no murmur, click, rub or gallop GI: approp TTP, active BS, ND  Lab Results:   Recent Labs  01/27/13 0428 01/28/13 0430  WBC 20.4* 24.8*  HGB 6.7* 8.6*  HCT 20.4* 25.5*  PLT 152 206   BMET  Recent Labs  01/27/13 0428 01/28/13 0500  NA 136 135  K 3.4* 3.5  CL 100 99  CO2 24 22  GLUCOSE 266* 82  BUN 7 5*  CREATININE 0.48* 0.37*  CALCIUM 7.1* 7.6*   PT/INR No results found for this basename: LABPROT, INR,  in the last 72 hours ABG No results found for this basename: PHART, PCO2, PO2, HCO3,  in the last 72 hours  Studies/Results: Dg Chest Port 1 View  01/27/2013   *RADIOLOGY REPORT*  Clinical Data: Evaluate ET tube  PORTABLE CHEST - 1 VIEW  Comparison: 01/26/2013  Findings: Endotracheal tube tip is above the carina. Nasogastric tube is in the stomach.  There is a left IJ catheter with tip in the projection of the SVC.  Heart size appears normal.  There are bilateral pleural effusions identified left greater than right. Compared with previous exam there has been improved appearance of the right effusion.  IMPRESSION:  1. Decrease in right pleural effusion.   Original Report Authenticated By: Signa Kell, M.D.     Assessment/Plan: s/p Procedure(s): Abdominal Washout  and Abdominal Closure with Removal Wound Vac (N/A) Mobilize OOBTC KUB  If KUB Ok today, OK with liquids, otherwise pt may need TNA.   Pt may have ileus 2/2 to prolonged open abd.   LOS: 7 days    Marigene Ehlers., [facility] 01/28/2013

## 2013-01-28 NOTE — Progress Notes (Signed)
eLink Nursing ICU Electrolyte Replacement Protocol  Patient Name: Mackenzie [name] DOB: 1986/02/10 MRN: [id number]  Date of Service  01/28/2013 K+ 3.5  Mg 1.4 Electrolyte protocol criteria met. Labs replaced per protocol. MD notified.  HPI/Events of Note    Recent Labs Lab 01/22/13 1210 01/22/13 1745  01/25/13 1047 01/25/13 1847 01/26/13 0400 01/27/13 0428 01/28/13 0500  NA 135 133*  < > 137 138 139 136 135  K 3.9 3.9  < > 4.8 4.5 4.0 3.4* 3.5  CL 103 102  < > 106 105 105 100 99  CO2 24 23  < > 21 22 23 24 22   GLUCOSE 104* 148*  < > 84 86 88 266* 82  BUN 8 12  < > 18 17 15 7  5*  CREATININE 0.84 0.77  < > 0.66 0.64 0.60 0.48* 0.37*  CALCIUM 7.8* 7.4*  < > 7.3* 7.6* 7.4* 7.1* 7.6*  MG  --  1.1*  --   --   --   --   --  1.4*  PHOS  --  5.0*  --   --   --   --   --  2.5  < > = values in this interval not displayed.  Estimated Creatinine Clearance: 93.2 ml/min (by C-G formula based on Cr of 0.37).  Intake/Output     05/28 0701 - 05/29 0700   I.V. (mL/kg) 1057.7 (15.6)   Blood 362.5   NG/GT 105   IV Piggyback 150   Total Intake(mL/kg) 1675.2 (24.6)   Urine (mL/kg/hr) 1580 (1)   Blood 50 (0)   Total Output 1630   Net +45.2        - I/O DETAILED x24h    Total I/O In: 230 [I.V.:200; NG/GT:30] Out: 750 [Urine:750] - I/O THIS SHIFT    ASSESSMENT   eICURN Interventions     ASSESSMENT: MAJOR ELECTROLYTE    [name] 01/28/2013, 6:31 AM

## 2013-01-28 NOTE — Progress Notes (Signed)
PULMONARY  / CRITICAL CARE MEDICINE  Name: Mackenzie [name] MRN: [id number] DOB: 05/18/1986    ADMISSION DATE:  01/21/2013 CONSULTATION DATE:  5/23  REFERRING MD :  Dr Jolayne Panther PRIMARY SERVICE: OB   CHIEF COMPLAINT:  shock  BRIEF PATIENT DESCRIPTION: 27 yr old 58 weeks pregant, STAT Csection after failed labor, uterine rupture, shock post op  SIGNIFICANT EVENTS / STUDIES:  5/23- uterine rupture, massive blood loss and prbc Tx, shock  5/24- retroperitoneal bleed significant  5-24 to OR for wound vac  5-24 to IR for embolization of left iliac artery branch 5/28- plan back to OR  LINES / TUBES: Left ij 5/23>>> 5/24 ETT>>>  CULTURES: [facility] 5/24 resp cult 5/24>>>NF  ANTIBIOTICS: Zosyn 5/23 (concern infected amniotic)>>>  SUBJECTIVE:  Weaning this am, for one unit pRBC  VITAL SIGNS: Temp:  [98.6 F (37 C)-102.7 F (39.3 C)] 100.3 F (37.9 C) (05/29 0844) Pulse Rate:  [87-109] 98 (05/29 0800) Resp:  [11-24] 23 (05/29 0800) BP: (88-129)/(50-96) 115/83 mmHg (05/29 0800) SpO2:  [96 %-100 %] 96 % (05/29 0800) FiO2 (%):  [40 %] 40 % (05/28 1400) HEMODYNAMICS: CVP:  [7 mmHg] 7 mmHg VENTILATOR SETTINGS: Vent Mode:  [-]  FiO2 (%):  [40 %] 40 % INTAKE / OUTPUT: Intake/Output     05/28 0701 - 05/29 0700 05/29 0701 - 05/30 0700   I.V. (mL/kg) 1097.7 (16.1) 5603.4 (82.4)   Blood 362.5    NG/GT 105    IV Piggyback 150    Total Intake(mL/kg) 1715.2 (25.2) 5603.4 (82.4)   Urine (mL/kg/hr) 1580 (1)    Emesis/NG output     Drains     Blood 50 (0)    Total Output 1630     Net +85.2 +5603.4          PHYSICAL EXAMINATION: General:  Vented, awake, follows commands, calm Neuro:  nonfocal exam, follows commands HEENT:  jvd increased Cardiovascular:  s1 s2 rrt disant Lungs: coarse diffuse mild Abdomen:  Distended abdo, wound vac in place, open Skin:  No rash  LABS:  Recent Labs Lab 01/22/13 1210  01/22/13 1745  01/22/13 1854  01/22/13 2355  01/23/13 0515 01/23/13 0952  01/23/13 1039 01/23/13 1247  01/23/13 2029 01/23/13 2030  01/24/13 0435  01/25/13 0433  01/26/13 0400 01/26/13 2000 01/27/13 0428 01/28/13 0430 01/28/13 0500  HGB  --   < > 7.4*  --   --   < >  --   < > 8.4* 6.3*  --  3.4*  < >  --  9.9*  < > 9.0*  < >  --   < > 7.3* 7.0* 6.7* 8.6*  --   WBC  --   < > 17.7*  --   --   < >  --   < > 16.5* 14.5*  --   --   < >  --  12.1*  < > 15.7*  < >  --   < > 22.4* 21.6* 20.4* 24.8*  --   PLT 114*  < > 124*  124*  --   --   < >  --   < > 64*  67* 70*  PLATELETS APPEAR ADEQUATE  --   --   < >  --  105*  < > 91*  < >  --   < > 135* 143* 152 206  --   NA 135  --  133*  --   --   < > 131*  --  132* 134*  --  137  --   --  131*  --  134*  < >  --   < > 139  --  136  --  135  K 3.9  --  3.9  --   --   < > 4.0  --  4.1 4.3  --  4.1  --   --  4.5  --  4.5  < >  --   < > 4.0  --  3.4*  --  3.5  CL 103  --  102  --   --   < > 100  --  102 104  --   --   --   --  101  --  105  < >  --   < > 105  --  100  --  99  CO2 24  --  23  --   --   < > 22  --  24 21  --   --   --   --  23  --  20  < >  --   < > 23  --  24  --  22  GLUCOSE 104*  --  148*  --   --   < > 162*  --  111* 134*  --   --   --   --  122*  --  100*  < >  --   < > 88  --  266*  --  82  BUN 8  --  12  --   --   < > 16  --  17 19  --   --   --   --  17  --  20  < >  --   < > 15  --  7  --  5*  CREATININE 0.84  --  0.77  --   --   < > 0.87  --  0.84 0.88  --   --   --   --  0.66  --  0.77  < >  --   < > 0.60  --  0.48*  --  0.37*  CALCIUM 7.8*  --  7.4*  --   --   < > 7.1*  --  7.4* 6.6*  --   --   --   --  7.0*  --  6.8*  < >  --   < > 7.4*  --  7.1*  --  7.6*  MG  --   --  1.1*  --   --   --   --   --   --   --   --   --   --   --   --   --   --   --   --   --   --   --   --   --  1.4*  PHOS  --   --  5.0*  --   --   --   --   --   --   --   --   --   --   --   --   --   --   --   --   --   --   --   --   --  2.5  AST  --   < > 18  --   --   --  22  --  21 19  --   --   --   --   --   --  59*  --   --    --   --   --  31  --   --   ALT  --   < > 9  --   --   --  9  --  9 8  --   --   --   --   --   --  41*  --   --   --   --   --  27  --   --   ALKPHOS  --   < > 43  --   --   --  44  --  44 36*  --   --   --   --   --   --  39  --   --   --   --   --  49  --   --   BILITOT  --   < > 3.1*  --   --   --  1.6*  --  1.3* 0.9  --   --   --   --   --   --  0.9  --   --   --   --   --  0.6  --   --   PROT  --   < > 3.4*  --   --   --  3.5*  --  3.7* 3.3*  --   --   --   --   --   --  3.9*  --   --   --   --   --  4.1*  --   --   ALBUMIN  --   < > 1.6*  --   --   --  1.5*  --  1.5* 1.4*  --   --   --   --   --   --  1.4*  --   --   --   --   --  1.3*  --   --   APTT 27  --  92*  --   --   --  32  --  31 34  34  --   --   --   --   --   --   --   --   --   --   --   --   --   --   --   INR 1.20  --  1.48  --   --   --  1.39  --  1.19 1.33  1.31  --   --   --   --  1.06  --   --   --   --   --   --   --   --   --   --   LATICACIDVEN  --   --  2.9*  --   --   --  2.6*  --   --  2.3*  --   --   --   --   --   --   --   --   --   --   --   --   --   --   --   TROPONINI  --   --   --   --   --   --  <0.30  --  <  0.30  --   --   --   --   --   --   --   --   --   --   --   --   --   --   --   --   PROCALCITON  --   --   --   --   --   --   --   --   --  3.33  --   --   --   --   --   --  4.46  --  3.77  --   --   --   --   --   --   PHART  --   --   --   < > 7.469*  --  7.457*  --   --   --  7.429 7.378  --  7.385  --   --   --   --   --   --   --   --   --   --   --   PCO2ART  --   --   --   < > 30.7*  --  30.1*  --   --   --  36.2 39.9  --  40.8  --   --   --   --   --   --   --   --   --   --   --   PO2ART  --   --   --   < > 112.0*  --  127.0*  --   --   --  350.0* 317.0*  --  183.0*  --   --   --   --   --   --   --   --   --   --   --   < > = values in this interval not displayed.  Recent Labs Lab 01/27/13 2029 01/28/13 0037 01/28/13 0352 01/28/13 0844 01/28/13 1139  GLUCAP 79 83 80 78 93    CXR:  bibasilr effusion, ett wnl, line wnl  ASSESSMENT / PLAN:  PULMONARY A: Acute resp failure At risk TRALI (not noted), edema / effusions bibasilr P:   - Lasix as ordered for positive balance today. - Extubated, titrate O2 for sat of 92-95%. - IS as needed.  CARDIOVASCULAR A: Shock, d/t hemorrhage, now resolved.  Off all vasopressors  P:  - Monitor tele. - Continue lasix.  RENAL A:Mild hypokalemia, neg balance P:   - Replace K, Mg and Phos. - Lasix 20 mg IV q8 x2 doses, keep foley for lasix. - KVO IVF.  GASTROINTESTINAL A: Iliac artery bleed s/p hysterectomy.  S/p lap per surgery P:   - PPI. - Closed, further recommendations per surgrey. - Hold off TPN for today, no flatus and no bowel sounds yet, if unable to use GI tract by AM will start TPN.  HEMATOLOGIC A:  S/p massive blood loss d/t iliac artery bleed, hemoconcentration of plat P:  - F/u cbc with further neg balance.  INFECTIOUS A:  Concern GYN infection amniotic? Now hysterectomy, open abdo, leukocytosis common in retroper bleed P:   - Will continue zosyn for now, if afebrile overnight and WBC is stable will d/c in AM.  ENDOCRINE A:  Glu controlled P:   SSI  NEUROLOGIC A:  pain P:   - Fentanyl for pain.  TODAY'S SUMMARY: Extubated, will move to SDU and to [facility], if able  to use GI tract tomorrow start diet, if not start TPN, if no fever overnight and WBC is stable d/c zosyn.  I have personally obtained a history, examined the patient, evaluated laboratory and imaging results, formulated the assessment and plan and placed orders.  Alyson Reedy, M.D. [facility] Pulmonary/Critical Care Medicine. Pager: [phone]. After hours pager: [phone].

## 2013-01-29 ENCOUNTER — Encounter (HOSPITAL_COMMUNITY): Payer: Self-pay | Admitting: General Surgery

## 2013-01-29 LAB — GLUCOSE, CAPILLARY
Glucose-Capillary: 100 mg/dL — ABNORMAL HIGH (ref 70–99)
Glucose-Capillary: 118 mg/dL — ABNORMAL HIGH (ref 70–99)
Glucose-Capillary: 85 mg/dL (ref 70–99)
Glucose-Capillary: 99 mg/dL (ref 70–99)
Glucose-Capillary: 99 mg/dL (ref 70–99)

## 2013-01-29 LAB — BASIC METABOLIC PANEL
BUN: 10 mg/dL (ref 6–23)
CO2: 25 mEq/L (ref 19–32)
Chloride: 97 mEq/L (ref 96–112)
GFR calc non Af Amer: >90 mL/min (ref >90)
Glucose, Bld: 104 mg/dL — ABNORMAL HIGH (ref 70–99)
Potassium: 3.6 mEq/L (ref 3.5–5.1)
Sodium: 133 mEq/L — ABNORMAL LOW (ref 135–145)

## 2013-01-29 LAB — CBC
HCT: 27.1 % — ABNORMAL LOW (ref 36.0–46.0)
Hemoglobin: 9.1 g/dL — ABNORMAL LOW (ref 12.0–15.0)
MCH: 29.2 pg (ref 26.0–34.0)
MCV: 86.9 fL (ref 78.0–100.0)
RBC: 3.12 MIL/uL — ABNORMAL LOW (ref 3.87–5.11)
WBC: 24.5 10*3/uL — ABNORMAL HIGH (ref 4.0–10.5)

## 2013-01-29 NOTE — Progress Notes (Signed)
Patient ID: Mackenzie [name], female   DOB: 23-Dec-1985, 27 y.o.   MRN: [id number] 2 Days Post-Op  Subjective: Doing ok, pain with movement but otherwise comfortable, had BM this am and having flatus, denies n/v  Objective: Vital signs in last 24 hours: Temp:  [98.6 F (37 C)-100.3 F (37.9 C)] 99.3 F (37.4 C) (05/30 0739) Pulse Rate:  [90-112] 108 (05/30 0739) Resp:  [18-26] 21 (05/30 0739) BP: (99-131)/(52-88) 131/52 mmHg (05/30 0801) SpO2:  [95 %-99 %] 98 % (05/30 0739) Weight:  [136 lb 7.4 oz (61.9 kg)-152 lb 5.4 oz (69.1 kg)] 152 lb 5.4 oz (69.1 kg) (05/30 0739) Last BM Date:  (pts)  Intake/Output from previous day: 05/29 0701 - 05/30 0700 In: 5708.4 [I.V.:5623.4; IV Piggyback:85] Out: 2576 [Urine:2575; Stool:1] Intake/Output this shift:    General appearance: alert and cooperative, NAD Cardio: regular rate and rhythm GI: expected tenderness, some bleeding inbetween staples, tissue healthy, +bs, overall soft Ext: no edema  Lab Results:   Recent Labs  01/28/13 0430 01/29/13 0456  WBC 24.8* 24.5*  HGB 8.6* 9.1*  HCT 25.5* 27.1*  PLT 206 342   BMET  Recent Labs  01/28/13 0500 01/29/13 0456  NA 135 133*  K 3.5 3.6  CL 99 97  CO2 22 25  GLUCOSE 82 104*  BUN 5* 10  CREATININE 0.37* 0.38*  CALCIUM 7.6* 7.6*   PT/INR No results found for this basename: LABPROT, INR,  in the last 72 hours ABG No results found for this basename: PHART, PCO2, PO2, HCO3,  in the last 72 hours  Studies/Results: Dg Chest Portable 1 View  01/28/2013   *RADIOLOGY REPORT*  Clinical Data: Respiratory failure.  PORTABLE CHEST - 1 VIEW  Comparison: 01/27/2013  Findings: The endotracheal tube has been removed.  Central line shows stable positioning.  There is increased atelectasis of the right lower lung.  Stable consolidation of the left lower lobe. The heart size is within normal limits.  IMPRESSION: Increased atelectasis of the right lower lung after extubation.   Original Report  Authenticated By: Irish Lack, M.D.   Dg Abd Portable 1v  01/28/2013   *RADIOLOGY REPORT*  Clinical Data: Ileus.  Abdominal distention.  PORTABLE ABDOMEN - 1 VIEW  Comparison: 01/25/2013  Findings: Improvement in bowel gas distention since prior study. Mild gaseous distention of transverse colon persists.  NG tube is in the stomach.  No visible free air.  No organomegaly or suspicious calcification.  IMPRESSION: Improving ileus pattern.   Original Report Authenticated By: Charlett Nose, M.D.     Assessment/Plan: POD#6-Exploratory laparotomy, intra-abdominal packing, vac placement POD#4-Removal of VAC sponge,exploratory laparotomy, placement of VAC sponge POD#2-Vac and sponge removal x 1, abd washout and evacuation of retroperitoneal hematoma, abd wall closure  Plan: Clear liquids today, start to mobilize, will get PT to make sure she will not need home health, transfer to 6 north, dressing changes qshift.   LOS: 8 days    Micholas Drumwright 01/29/2013

## 2013-01-29 NOTE — Evaluation (Signed)
Physical Therapy Evaluation Patient Details Name: Mackenzie [name] MRN: [id number] DOB: 02-10-1986 Today's Date: 01/29/2013 Time: 5284-1324 PT Time Calculation (min): 22 min  PT Assessment / Plan / Recommendation Clinical Impression  Pt is 27 y/o female admitted due to s/p STAT  c-section after failed labor resulting blood loss and uterine rupture and VDRF.  Pt will benefit from acute PT services to improve overall mobility and prepare for safe d/c home with friends.      PT Assessment  Patient needs continued PT services    Follow Up Recommendations  Home health PT;Supervision/Assistance - 24 hour    Barriers to Discharge Decreased caregiver support Friend is taking care of baby while husband at the hospital.  Husband will have to return to work soon.     Equipment Recommendations   (will continue to assess)    Recommendations for Other Services OT consult   Frequency Min 3X/week    Precautions / Restrictions Precautions Precautions: Fall (Must wear abdominal binder) Restrictions Weight Bearing Restrictions: No   Pertinent Vitals/Pain 7/10 abdominal pain; RN notified      Mobility  Bed Mobility Bed Mobility: Supine to Sit Supine to Sit: 3: Mod assist;With rails;HOB elevated Details for Bed Mobility Assistance: (A) to elevate trunk OOB due to pain Transfers Transfers: Sit to Stand;Stand to Sit Sit to Stand: 1: +2 Total assist;From bed Sit to Stand: Patient Percentage: 60% Stand to Sit: 1: +2 Total assist;To chair/3-in-1 Stand to Sit: Patient Percentage: 60% Details for Transfer Assistance: +2 (A) to initiate transfer and maintain balance Ambulation/Gait Ambulation/Gait Assistance: 1: +2 Total assist Ambulation/Gait: Patient Percentage: 60% Ambulation Distance (Feet): 4 Feet Assistive device: 2 person hand held assist Ambulation/Gait Assistance Details: +2 (A) to maintain balance with heavy use of UEs. Gait Pattern: Step-to pattern;Decreased stride  length;Shuffle;Antalgic Gait velocity: decreased due to pain Stairs: No Wheelchair Mobility Wheelchair Mobility: No    Exercises     PT Diagnosis: Difficulty walking;Acute pain;Generalized weakness  PT Problem List: Decreased strength;Decreased activity tolerance;Decreased balance;Decreased mobility;Decreased knowledge of use of DME;Pain PT Treatment Interventions: DME instruction;Gait training;Stair training;Functional mobility training;Therapeutic activities;Therapeutic exercise;Balance training;Patient/family education   PT Goals Acute Rehab PT Goals PT Goal Formulation: With patient Time For Goal Achievement: 02/05/13 Potential to Achieve Goals: Good Pt will go Supine/Side to Sit: with modified independence PT Goal: Supine/Side to Sit - Progress: Goal set today Pt will go Sit to Supine/Side: with modified independence PT Goal: Sit to Supine/Side - Progress: Goal set today Pt will go Sit to Stand: with modified independence PT Goal: Sit to Stand - Progress: Goal set today Pt will go Stand to Sit: with modified independence PT Goal: Stand to Sit - Progress: Goal set today Pt will Ambulate: 51 - 150 feet;with modified independence;with least restrictive assistive device PT Goal: Ambulate - Progress: Goal set today  Visit Information  Last PT Received On: 01/29/13 Assistance Needed: +2    Subjective Data  Subjective: "Yes" (when asked if having pain) Patient Stated Goal: To go home and able to help with baby   Prior Functioning  Home Living Lives With: Friend(s) Available Help at Discharge: Friend(s) Type of Home: Apartment Home Access: Level entry Home Layout: One level Bathroom Shower/Tub: Engineer, manufacturing systems: Standard Bathroom Accessibility: Yes Prior Function Level of Independence: Independent Able to Take Stairs?: Yes Driving: Yes Vocation: Unemployed Communication Communication: Prefers language other than English    Cognition   Cognition Arousal/Alertness: Awake/alert Behavior During Therapy: WFL for tasks assessed/performed Overall Cognitive Status:  Within Functional Limits for tasks assessed    Extremity/Trunk Assessment Right Lower Extremity Assessment RLE ROM/Strength/Tone: Deficits;Unable to fully assess;Due to pain RLE ROM/Strength/Tone Deficits: At least 3/5 gross however noticeable bilateral knee buckling in standing Left Lower Extremity Assessment LLE ROM/Strength/Tone: Deficits;Unable to fully assess;Due to pain LLE ROM/Strength/Tone Deficits: At least 3/5 gross however noticeable bilateral knee buckling in standing   Balance Balance Balance Assessed: Yes Static Sitting Balance Static Sitting - Balance Support: Feet supported;Bilateral upper extremity supported Static Sitting - Level of Assistance: 4: Min assist Static Sitting - Comment/# of Minutes: Initial min (a) due to posterior lean due to pain  End of Session PT - End of Session Equipment Utilized During Treatment: Other (comment) (abdominal binder) Activity Tolerance: Patient limited by pain Patient left: in chair;with call bell/phone within reach;with family/visitor present Nurse Communication: Mobility status  GP     Vollie Brunty 01/29/2013, 9:56 AM Jake Shark, PT DPT [phone]

## 2013-01-29 NOTE — Progress Notes (Signed)
Transferred to 6N per hospital bed, alert and oriented.  Husband was called but he is in the doctor's office.

## 2013-01-29 NOTE — Anesthesia Postprocedure Evaluation (Signed)
  Anesthesia Post-op Note  Patient: Mackenzie [name]  Procedure(s) Performed: Procedure(s): EXPLORATORY LAPAROTOMY (N/A) ABDOMINAL VACUUM ASSISTED CLOSURE CHANGE (N/A)  Patient Location: SICU  Anesthesia Type:General  Level of Consciousness: sedated and Patient remains intubated per anesthesia plan  Airway and Oxygen Therapy: Patient placed on Ventilator (see vital sign flow sheet for setting)  Post-op Pain: none  Post-op Assessment: Post-op Vital signs reviewed  Post-op Vital Signs: stable  Complications: No apparent anesthesia complications

## 2013-01-29 NOTE — Progress Notes (Signed)
I have seen and examined the pt and agree with PA-White's progress note.  + BM.  Slowly adv diet as tol To floor

## 2013-01-29 NOTE — Progress Notes (Signed)
NUTRITION FOLLOW UP  Intervention:   1.  Supplements; Resource Breeze TID between meals.   Nutrition Dx:   Inadequate oral intake, ongoing.   Monitor:   1.  Food/Beverage; diet advancement with intake sufficient to meet >/=90% estimated needs  Assessment:   Pt admitted with hemorrhagic shock s/p urterine rupture after c-section.   Needs assessment with recommendations for enteral nutrition if prolonged mechanical ventilation occurred (5/26).  Pt now extubated and on clear liquids.     Height: Ht Readings from Last 1 Encounters:  01/28/13 5\' 1"  (1.549 m)    Weight Status:   Wt Readings from Last 1 Encounters:  01/29/13 137 lb 12.6 oz (62.5 kg)    Re-estimated needs:  Kcal: 1750-2000 Protein: 80-95g Fluid: ~2.0 L/day  Skin: incision  Diet Order: Clear Liquid   Intake/Output Summary (Last 24 hours) at 01/29/13 1459 Last data filed at 01/29/13 1130  Gross per 24 hour  Intake     30 ml  Output   2202 ml  Net  -2172 ml    Last BM: 5/30, watery diarrhea   Labs:   Recent Labs Lab 01/22/13 1745  01/27/13 0428 01/28/13 0500 01/28/13 2000 01/29/13 0456  NA 133*  < > 136 135  --  133*  K 3.9  < > 3.4* 3.5  --  3.6  CL 102  < > 100 99  --  97  CO2 23  < > 24 22  --  25  BUN 12  < > 7 5*  --  10  CREATININE 0.77  < > 0.48* 0.37*  --  0.38*  CALCIUM 7.4*  < > 7.1* 7.6*  --  7.6*  MG 1.1*  --   --  1.4* 2.2 1.9  PHOS 5.0*  --   --  2.5  --  2.5  GLUCOSE 148*  < > 266* 82  --  104*  < > = values in this interval not displayed.  CBG (last 3)   Recent Labs  01/29/13 0432 01/29/13 0900 01/29/13 1344  GLUCAP 100* 121* 85    Scheduled Meds: . antiseptic oral rinse  15 mL Mouth Rinse QID  . chlorhexidine  15 mL Mouth Rinse BID  . insulin aspart  0-9 Units Subcutaneous Q4H  . midazolam  2-4 mg Intravenous Once  . pantoprazole (PROTONIX) IV  40 mg Intravenous QHS  . TDaP  0.5 mL Intramuscular Once    Continuous Infusions: . sodium chloride 20 mL/hr at  01/28/13 0900    Loyce Dys, MS RD LDN Clinical Inpatient Dietitian Pager: [phone] Weekend/After hours pager: [phone]

## 2013-01-29 NOTE — Progress Notes (Signed)
[facility] OB[facility]N Attending Progress Note  Post Partum Day 7  Subjective: Patient moved to Step Down unit yesterday.  NGT removed, patient has tolerated ice chips and sips of fluids.  She is resting in bed, reports occasional incisional pain.  She reports some flatus and a small bowel movement this morning.  Husband by her side and helped with translation.  Husband informed me that baby is doing well.  No obstetric concerns.    Objective: Blood pressure 99/71, pulse 94, temperature 99.2 F (37.3 C), temperature source Oral, resp. rate 25, height 5\' 1"  (1.549 m), weight 136 lb 7.4 oz (61.9 kg), last menstrual period 03/22/2012, SpO2 99.00%.  Intake/Output     05/29 0701 - 05/30 0700 05/30 0701 - 05/31 0700   I.V. (mL/kg) 5623.4 (90.8)    Blood     NG/GT     IV Piggyback 85    Total Intake(mL/kg) 5708.4 (92.2)    Urine (mL/kg/hr) 2575 (1.7)    Stool 1 (0)    Blood     Total Output 2576     Net +3132.4          Stool Occurrence 1 x     Physical Exam:  Gen: NAD Abdomen: Soft, mildly distended, active BS, abdominal binder in place Incisions: Vertical incision with dressing in place. Pfannenstiel incision healing well, no erythema, induration  DVT Evaluation: No cords or calf tenderness.  SCDs in place.  Results for orders placed during the hospital encounter of 01/21/13 (from the past 24 hour(s))  GLUCOSE, CAPILLARY     Status: None   Collection Time    01/28/13  8:44 AM      Result Value Range   Glucose-Capillary 78  70 - 99 mg/dL  GLUCOSE, CAPILLARY     Status: None   Collection Time    01/28/13 11:39 AM      Result Value Range   Glucose-Capillary 93  70 - 99 mg/dL  GLUCOSE, CAPILLARY     Status: Abnormal   Collection Time    01/28/13  4:00 PM      Result Value Range   Glucose-Capillary 116 (*) 70 - 99 mg/dL  GLUCOSE, CAPILLARY     Status: Abnormal   Collection Time    01/28/13  5:35 PM      Result Value Range   Glucose-Capillary 106 (*) 70 - 99 mg/dL   MAGNESIUM     Status: None   Collection Time    01/28/13  8:00 PM      Result Value Range   Magnesium 2.2  1.5 - 2.5 mg/dL  GLUCOSE, CAPILLARY     Status: Abnormal   Collection Time    01/28/13  8:12 PM      Result Value Range   Glucose-Capillary 115 (*) 70 - 99 mg/dL   Comment 1 Notify RN    GLUCOSE, CAPILLARY     Status: Abnormal   Collection Time    01/28/13 11:41 PM      Result Value Range   Glucose-Capillary 108 (*) 70 - 99 mg/dL   Comment 1 Notify RN    GLUCOSE, CAPILLARY     Status: None   Collection Time    01/28/13 11:55 PM      Result Value Range   Glucose-Capillary 99  70 - 99 mg/dL  GLUCOSE, CAPILLARY     Status: Abnormal   Collection Time    01/29/13  4:32 AM      Result Value  Range   Glucose-Capillary 100 (*) 70 - 99 mg/dL   Comment 1 Notify RN    BASIC METABOLIC PANEL     Status: Abnormal   Collection Time    01/29/13  4:56 AM      Result Value Range   Sodium 133 (*) 135 - 145 mEq/L   Potassium 3.6  3.5 - 5.1 mEq/L   Chloride 97  96 - 112 mEq/L   CO2 25  19 - 32 mEq/L   Glucose, Bld 104 (*) 70 - 99 mg/dL   BUN 10  6 - 23 mg/dL   Creatinine, Ser 2.95 (*) 0.50 - 1.10 mg/dL   Calcium 7.6 (*) 8.4 - 10.5 mg/dL   GFR calc non Af Amer >90  >90 mL/min   GFR calc Af Amer >90  >90 mL/min  MAGNESIUM     Status: None   Collection Time    01/29/13  4:56 AM      Result Value Range   Magnesium 1.9  1.5 - 2.5 mg/dL  CBC     Status: Abnormal   Collection Time    01/29/13  4:56 AM      Result Value Range   WBC 24.5 (*) 4.0 - 10.5 K/uL   RBC 3.12 (*) 3.87 - 5.11 MIL/uL   Hemoglobin 9.1 (*) 12.0 - 15.0 g/dL   HCT 62.1 (*) 30.8 - 65.7 %   MCV 86.9  78.0 - 100.0 fL   MCH 29.2  26.0 - 34.0 pg   MCHC 33.6  30.0 - 36.0 g/dL   RDW 84.6  96.2 - 95.2 %   Platelets 342  150 - 400 K/uL  PHOSPHORUS     Status: None   Collection Time    01/29/13  4:56 AM      Result Value Range   Phosphorus 2.5  2.3 - 4.6 mg/dL    8/41/3244   *RADIOLOGY REPORT*  Clinical Data:  Respiratory failure.  PORTABLE CHEST - 1 VIEW  Comparison: 01/27/2013  Findings: The endotracheal tube has been removed.  Central line shows stable positioning.  There is increased atelectasis of the right lower lung.  Stable consolidation of the left lower lobe. The heart size is within normal limits.  IMPRESSION: Increased atelectasis of the right lower lung after extubation.   Original Report Authenticated By: Irish Lack, M.D.   01/28/2013   *RADIOLOGY REPORT*  Clinical Data: Ileus.  Abdominal distention.  PORTABLE ABDOMEN - 1 VIEW  Comparison: 01/25/2013  Findings: Improvement in bowel gas distention since prior study. Mild gaseous distention of transverse colon persists.  NG tube is in the stomach.  No visible free air.  No organomegaly or suspicious calcification.  IMPRESSION: Improving ileus pattern.   Original Report Authenticated By: Charlett Nose, M.D.   Assessment/Plan: Postpartum day #7 s/p cesarean section with hysterectomy secondary to uterine rupture complicated by hemorrhagic shock and retroperitoneal hematoma s/p reexploration x 2 and packing and IR embolization of bleeding distal branch of the left internal iliac artery.  She underwent removal of packing on 01/25/13, and evacuation of retroperitoneal hematoma and abdominal wall closure on 01/27/13. Patient is extubated and currently hemodynamically stable with no obstetric concerns. Her ileus is resolving, NGT removed on 01/28/13.  She has now passed flatus and had a small bowel movement. - As per General Surgery PA, she will transferred to regular floor in [facility] today and will try clear liquids today with plan to advance diet as tolerated - Continue current care  by SICU/Stepdown unit and Surgery Teams - We will make her OB clinic follow up appointment in a couple weeks in the hope that the patient will be discharged to home in the next few days.  We will help with disposition planning as needed.  We appreciate the ongoing care of our  patient by the SICU/Stepdown unit and General Surgery teams.  We will continue to follow along and can be reached at any time for any obstetric concerns [facility] OB/GYN Attending on call [phone]).   LOS: 8 days   Jaynie Collins, MD, FACOG Attending Obstetrician & Gynecologist Faculty Practice, [facility] Division of [place]

## 2013-01-29 NOTE — Progress Notes (Signed)
Discussed with Dr Derrell Lolling who has agreed to take on CCS service. PCCM will sign off. Please call if we can be of further assistance  Billy Fischer, MD ; [facility] [phone].  After 5:30 PM or weekends, call [phone]

## 2013-01-30 LAB — CULTURE, BLOOD (ROUTINE X 2): Culture: NO GROWTH

## 2013-01-30 LAB — GLUCOSE, CAPILLARY
Glucose-Capillary: 93 mg/dL (ref 70–99)
Glucose-Capillary: 122 mg/dL — ABNORMAL HIGH (ref 70–99)

## 2013-01-30 NOTE — Progress Notes (Signed)
[facility] OB/GYN Attending Progress Note  Post Partum Day 8  Subjective: Patient moved to 6N yesterday.  Patient has tolerated liquid diet and has ambulated around the room.  She is resting in bed, reports occasional incisional pain.  She reports some more flatus and small bowel movement this morning.  Husband by her side and helped with translation.  Husband informed me that baby is doing well.  No obstetric concerns.    Objective: Blood pressure 97/65, pulse 86, temperature 97.9 F (36.6 C), temperature source Oral, resp. rate 18, height 5\' 1"  (1.549 m), weight 137 lb 12.6 oz (62.5 kg), last menstrual period 03/22/2012, SpO2 99.00%.  Intake/Output     05/30 0701 - 05/31 0700 05/31 0701 - 06/01 0700   P.O.  720   I.V. (mL/kg) 50 (0.8)    IV Piggyback     Total Intake(mL/kg) 50 (0.8) 720 (11.5)   Urine (mL/kg/hr) 2075 (1.4)    Stool 5 (0)    Total Output 2080     Net -2030 +720         Physical Exam:  Gen: NAD Abdomen: Soft, mildly distended, mild TTP,  active BS. Vertical incision with staples, C/D/I, no erythema, or drainage. Pfannenstiel incision healing well, no erythema, induration  Extremities: No edema, cords or calf tenderness.  SCDs in place.      Component Value Date/Time   WBC 24.5* 01/29/2013 0456   RBC 3.12* 01/29/2013 0456   HGB 9.1* 01/29/2013 0456   HCT 27.1* 01/29/2013 0456   PLT 342 01/29/2013 0456   MCV 86.9 01/29/2013 0456   MCH 29.2 01/29/2013 0456   MCHC 33.6 01/29/2013 0456   RDW 14.6 01/29/2013 0456   LYMPHSABS 2.7 01/27/2013 0428   MONOABS 1.4* 01/27/2013 0428   EOSABS 0.4 01/27/2013 0428   BASOSABS 0.2* 01/27/2013 0428    Assessment/Plan: Postpartum day #8 s/p cesarean section with hysterectomy secondary to uterine rupture on 01/22/13 complicated by hemorrhagic shock and retroperitoneal hematoma s/p reexploration x 2 and packing and IR embolization of bleeding distal branch of the left internal iliac artery on 01/23/13.  She underwent removal of  packing on 01/25/13, and evacuation of retroperitoneal hematoma and abdominal wall closure on 01/27/13. Patient is extubated and currently hemodynamically stable with no obstetric concerns. Her ileus resolved, NGT removed on 01/28/13.  She has now passed flatus and had bowel movements. - Continue to advance diet as tolerated and encourage ambulation - Continue current care by General Surgery team  We appreciate the ongoing care of our patient by the General Surgery team.  We will continue to follow along and can be reached at any time for any obstetric concerns [facility] OB/GYN Attending on call [phone]).   LOS: 9 days   Jaynie Collins, MD, FACOG Attending Obstetrician & Gynecologist Faculty Practice, [facility] [place]

## 2013-01-30 NOTE — Progress Notes (Signed)
Patient ID: Mackenzie [name], female   DOB: 26-Mar-1986, 27 y.o.   MRN: [id number] 3 Days Post-Op  Subjective: Doing ok, pain with movement but otherwise comfortable, having BM's flatus, denies n/v  Objective: Vital signs in last 24 hours: Temp:  [97.7 F (36.5 C)-98.9 F (37.2 C)] 97.7 F (36.5 C) (05/31 0542) Pulse Rate:  [83-103] 83 (05/31 0542) Resp:  [18-23] 18 (05/31 0542) BP: (101-115)/(72-77) 106/74 mmHg (05/31 0542) SpO2:  [97 %-100 %] 100 % (05/31 0542) Last BM Date: 01/29/13  Intake/Output from previous day: 05/30 0701 - 05/31 0700 In: 50 [I.V.:50] Out: 2080 [Urine:2075; Stool:5] Intake/Output this shift:    General appearance: alert and cooperative, NAD Cardio: regular rate and rhythm GI: expected tenderness, tissue healthy, +bs, overall soft but distended Ext: no edema  Lab Results:   Recent Labs  01/28/13 0430 01/29/13 0456  WBC 24.8* 24.5*  HGB 8.6* 9.1*  HCT 25.5* 27.1*  PLT 206 342   BMET  Recent Labs  01/28/13 0500 01/29/13 0456  NA 135 133*  K 3.5 3.6  CL 99 97  CO2 22 25  GLUCOSE 82 104*  BUN 5* 10  CREATININE 0.37* 0.38*  CALCIUM 7.6* 7.6*   PT/INR No results found for this basename: LABPROT, INR,  in the last 72 hours ABG No results found for this basename: PHART, PCO2, PO2, HCO3,  in the last 72 hours  Studies/Results: No results found.   Assessment/Plan: POD#7-Exploratory laparotomy, intra-abdominal packing, vac placement POD#5-Removal of VAC sponge,exploratory laparotomy, placement of VAC sponge POD#3-Vac and sponge removal x 1, abd washout and evacuation of retroperitoneal hematoma, abd wall closure  Plan: Full liquids today, start to mobilize, Cont PT to make sure she will not need home health, dressing changes q day, d/c foley, d/c cvc   LOS: 9 days    Mackenzie [name] C. 01/30/2013

## 2013-01-31 LAB — CBC
MCH: 30.4 pg (ref 26.0–34.0)
MCHC: 33.6 g/dL (ref 30.0–36.0)
MCV: 90.4 fL (ref 78.0–100.0)
Platelets: 462 10*3/uL — ABNORMAL HIGH (ref 150–400)
RDW: 15 % (ref 11.5–15.5)
WBC: 16.2 10*3/uL — ABNORMAL HIGH (ref 4.0–10.5)

## 2013-01-31 LAB — TYPE AND SCREEN
Unit division: 0
Unit division: 0

## 2013-01-31 LAB — GLUCOSE, CAPILLARY: Glucose-Capillary: 85 mg/dL (ref 70–99)

## 2013-01-31 MED ORDER — HYDROCODONE-ACETAMINOPHEN 5-325 MG PO TABS
1.0000 | ORAL_TABLET | ORAL | Status: DC | PRN
  Administered 2013-01-31 (×2): 2 via ORAL
  Filled 2013-01-31 (×2): qty 2

## 2013-01-31 MED ORDER — PANTOPRAZOLE SODIUM 40 MG PO TBEC
40.0000 mg | DELAYED_RELEASE_TABLET | Freq: Every day | ORAL | Status: DC
  Administered 2013-01-31 – 2013-02-01 (×2): 40 mg via ORAL
  Filled 2013-01-31 (×2): qty 1

## 2013-01-31 MED ORDER — FERROUS GLUCONATE 324 (38 FE) MG PO TABS
324.0000 mg | ORAL_TABLET | Freq: Every day | ORAL | Status: DC
  Administered 2013-01-31 – 2013-02-01 (×2): 324 mg via ORAL
  Filled 2013-01-31 (×5): qty 1

## 2013-01-31 NOTE — Progress Notes (Signed)
[facility] OB/GYN Attending Progress Note  Post Partum Day 9  Subjective: Patient tolerated regular diet and has ambulated around the room.  She is resting in bed, reports occasional incisional pain.  She reports some more flatus and  bowel movements.  Husband by her side and helped with translation.  Husband informed me that baby is doing well.  No obstetric concerns.    Objective: Blood pressure 102/64, pulse 80, temperature 97.5 F (36.4 C), temperature source Oral, resp. rate 18, height 5\' 1"  (1.549 m), weight 137 lb 12.6 oz (62.5 kg), last menstrual period 03/22/2012, SpO2 100.00%.  Intake/Output     05/31 0701 - 06/01 0700 06/01 0701 - 06/02 0700   P.O. 720 240   I.V. (mL/kg)     Total Intake(mL/kg) 720 (11.5) 240 (3.8)   Urine (mL/kg/hr) 675 (0.5)    Stool 1 (0)    Total Output 676     Net +44 +240        Urine Occurrence 2 x 1 x    Physical Exam:  Gen: NAD Abdomen: Soft, mildly distended, mild TTP,  active BS. Vertical incision with staples, C/D/I, no erythema, or drainage. Pfannenstiel incision healing well, no erythema, induration  Extremities: No edema, cords or calf tenderness.  SCDs in place.      Component Value Date/Time   WBC 16.2* 01/31/2013 0452   RBC 3.13* 01/31/2013 0452   HGB 9.5* 01/31/2013 0452   HCT 28.3* 01/31/2013 0452   PLT 462* 01/31/2013 0452   MCV 90.4 01/31/2013 0452   MCH 30.4 01/31/2013 0452   MCHC 33.6 01/31/2013 0452   RDW 15.0 01/31/2013 0452   LYMPHSABS 2.7 01/27/2013 0428   MONOABS 1.4* 01/27/2013 0428   EOSABS 0.4 01/27/2013 0428   BASOSABS 0.2* 01/27/2013 0428    Assessment/Plan: Postpartum day #9 s/p cesarean section with hysterectomy secondary to uterine rupture on 01/22/13 complicated by hemorrhagic shock and retroperitoneal hematoma s/p reexploration x 2 and packing and IR embolization of bleeding distal branch of the left internal iliac artery on 01/23/13.  She underwent removal of packing on 01/25/13, and evacuation of retroperitoneal  hematoma and abdominal wall closure on 01/27/13. Patient is extubated and currently hemodynamically stable with no obstetric concerns. Her ileus resolved, NGT removed on 01/28/13.  She has now passed flatus and had bowel movements. - Continue to regular diet as tolerated and encourage continued ambulation - Continue current care by General Surgery team - We will help with disposition planning as needed  We appreciate the ongoing care of our patient by the General Surgery team.  We will continue to follow along and can be reached at any time for any obstetric concerns [facility] Attending on call [phone]).   LOS: 10 days   Jaynie Collins, MD, FACOG Attending Obstetrician & Gynecologist Faculty Practice, [facility]

## 2013-01-31 NOTE — Progress Notes (Signed)
Patient ID: [name], female   DOB: [birth year], 27 y.o.   MRN: [id number] 4 Days Post-Op  Subjective: Doing better, pain with movement but otherwise comfortable, having BM's flatus, denies n/v  Objective: Vital signs in last 24 hours: Temp:  [97.9 F (36.6 C)-98.7 F (37.1 C)] 98.2 F (36.8 C) (06/01 0629) Pulse Rate:  [68-86] 68 (06/01 0629) Resp:  [18] 18 (06/01 0629) BP: (97-105)/(62-71) 100/64 mmHg (06/01 0629) SpO2:  [98 %-100 %] 100 % (06/01 0629) Last BM Date: 01/29/13  Intake/Output from previous day: 05/31 0701 - 06/01 0700 In: 720 [P.O.:720] Out: 676 [Urine:675; Stool:1] Intake/Output this shift:    General appearance: alert and cooperative, NAD Cardio: regular rate and rhythm GI: expected tenderness, tissue healthy, +bs, overall soft but distended Ext: no edema  Lab Results:   Recent Labs  01/29/13 0456 01/31/13 0452  WBC 24.5* 16.2*  HGB 9.1* 9.5*  HCT 27.1* 28.3*  PLT 342 462*   BMET  Recent Labs  01/29/13 0456  NA 133*  K 3.6  CL 97  CO2 25  GLUCOSE 104*  BUN 10  CREATININE 0.38*  CALCIUM 7.6*   PT/INR No results found for this basename: LABPROT, INR,  in the last 72 hours ABG No results found for this basename: PHART, PCO2, PO2, HCO3,  in the last 72 hours  Studies/Results: No results found.   Assessment/Plan: POD#8-Exploratory laparotomy, intra-abdominal packing, vac placement POD#6-Removal of VAC sponge,exploratory laparotomy, placement of VAC sponge POD#4-Vac and sponge removal x 1, abd washout and evacuation of retroperitoneal hematoma, abd wall closure  Plan: Reg diet today, start to mobilize, PO narctics, Cont PT to make sure she will not need home health, dressing changes q day D/C home soon   LOS: 10 days    Ulysee Fyock C. 01/31/2013

## 2013-02-01 MED ORDER — HYDROCODONE-ACETAMINOPHEN 5-325 MG PO TABS: 1.0000 | ORAL_TABLET | ORAL | Status: DC | PRN

## 2013-02-01 MED ORDER — ACETAMINOPHEN 160 MG/5ML PO SOLN: 650.0000 mg | ORAL | Status: DC | PRN

## 2013-02-01 MED ORDER — FERROUS GLUCONATE 324 (38 FE) MG PO TABS: 324.0000 mg | ORAL_TABLET | Freq: Every day | ORAL | Status: DC

## 2013-02-01 NOTE — Progress Notes (Signed)
Doing well.  Maybe d/c today or tues.

## 2013-02-01 NOTE — Progress Notes (Signed)
[facility] OB/GYN Attending Progress Note  Post Partum Day 10  Subjective: Patient continues to tolerate regular diet and ambulating around the room.  She is resting in bed, reports occasional mild incisional pain.  She reports some more flatus and bowel movements.  No obstetric concerns.    Objective: Blood pressure 105/66, pulse 80, temperature 98.4 F (36.9 C), temperature source Oral, resp. rate 18, height 5\' 1"  (1.549 m), weight 137 lb 12.6 oz (62.5 kg), last menstrual period 03/22/2012, SpO2 100.00%.  Intake/Output     06/01 0701 - 06/02 0700 06/02 0701 - 06/03 0700   P.O. 720    Total Intake(mL/kg) 720 (11.5)    Urine (mL/kg/hr)     Stool     Total Output       Net +720          Urine Occurrence 6 x     Physical Exam:  Gen: NAD Abdomen: Soft, active BS. Vertical incision with staples, C/D/I, no erythema, or drainage. Pfannenstiel incision healing well, no erythema, induration  Extremities: No edema, cords or calf tenderness.  SCDs in place.      Component Value Date/Time   WBC 16.2* 01/31/2013 0452   RBC 3.13* 01/31/2013 0452   HGB 9.5* 01/31/2013 0452   HCT 28.3* 01/31/2013 0452   PLT 462* 01/31/2013 0452   MCV 90.4 01/31/2013 0452   MCH 30.4 01/31/2013 0452   MCHC 33.6 01/31/2013 0452   RDW 15.0 01/31/2013 0452   LYMPHSABS 2.7 01/27/2013 0428   MONOABS 1.4* 01/27/2013 0428   EOSABS 0.4 01/27/2013 0428   BASOSABS 0.2* 01/27/2013 0428    Assessment/Plan: Patient is s/p cesarean section with hysterectomy secondary to uterine rupture on 01/22/13 complicated by hemorrhagic shock and retroperitoneal hematoma s/p reexploration x 2 and packing and IR embolization of bleeding distal branch of the left internal iliac artery on 01/23/13.  She underwent removal of packing on 01/25/13, and evacuation of retroperitoneal hematoma and abdominal wall closure on 01/27/13. Patient is extubated and currently hemodynamically stable with no obstetric concerns. Her ileus resolved, NGT removed on  01/28/13.  She has now passed flatus and had bowel movements.  Patient will be discharged to home today. Obstetric followup appointment scheduled in the [facility] on 02/11/13 at 4pm.   We appreciate the exceptional care of our patient by the General Surgery team. Patient has been scheduled to follow up with me in OB clinic on 02/11/13 and with General Surgery on 02/12/13 for staple removal.  Routine discharge instructions were discussed with the patient with the help of a [employer] phone interpreter.  Please refer to discharge summary and instructions for more details.   LOS: 11 days   Jaynie Collins, MD, FACOG Attending Obstetrician & Gynecologist Faculty Practice, [facility]

## 2013-02-01 NOTE — Progress Notes (Signed)
Discharge Note. OB and PA used interpretation line to educate pt on discharge. Pt's husband present for discharge. Rx's were reviewed. Follow up appointments reviewed. Pt was discharged to the care of her husband.

## 2013-02-01 NOTE — Discharge Summary (Signed)
Physician Discharge Summary  Mackenzie [name] ZOX:[id number] DOB: Apr 23, 1986 DOA: 01/21/2013  OB-GYN:  Tereso Newcomer  Consultation: Dr. Luisa Hart Wright(critical care)  Admit date: 01/21/2013 Discharge date: 02/01/2013  Recommendations for Outpatient Follow-up:    Follow-up Information   Follow up with CCS,MD, MD On 02/12/2013. (your appointment time is 10:20.  Be sure to arrive to your appointment 15 minutes early)    Contact information:   [address] [place] [address] [phone]       Follow up with Tereso Newcomer, MD On 02/11/2013. (Appointment time is 4 pm.  Please come  to [facility].)    Contact information:   [facility] [address] [place] [address] [phone]      Discharge Diagnoses:  1. Hemorrhagic shock 2. Uterine rupture 3. Retroperitoneal hematoma 4. Post operative respiratory failure   Surgical Procedure: Exploratory laparotomyDwain Sarna)  Discharge Condition: stable Disposition: discharge home  Diet recommendation: regular, iron supplement  Filed Weights   01/28/13 1700 01/29/13 0433 01/29/13 0739  Weight: 140 lb 6.9 oz (63.7 kg) 136 lb 7.4 oz (61.9 kg) 137 lb 12.6 oz (62.5 kg)     Hospital Course:  The patient was transferred from [facility] following cesarean hysterectomy complicated by massive hemorrhage, [redacted] weeks pregnant, failed labor, uterine rupture.  She continued to have bleeding and found to have a significant retroperitoneal bleed, massive blood loss.  She underwent urgent surgery by Dr. Dwain Sarna.  The bleeding was significant enough the abdomen was packed, blood continued to be given, wound was left open and wound vac was applied.  She then underwent embolization by interventional radiology.  The left distal branch of the left internal iliac artery was coiled successfully.  She was followed by critical care, surgery and GYN.  She required a second procedure to close  abdominal fascia.  She was given tube feeding.  Kept in ICU on vent, treated with vasopressors.  Eventually weaned off and transferred out of the unit.  Her diet was advanced, TF stopped.  Pt was mobilized and improved overall.  On POD #9 she was stable for discharge.  Her pain was under good control with oral medication, VSS, voiding and having BMs.  H&H 9.5/28.3.  She will need daily dressing changes.  She has a follow up scheduled with our office.  She does not need PT/OT.  Ambulate as tolerated, but no lifting.  She will need GYN follow up and will remain in the hospital until GYN sees her today.  I discussed discharge instructions with the patient via interpreter.      Discharge Instructions   Future Appointments Provider Department Dept Phone   02/12/2013 10:20 AM Emelia Loron, MD [facility] [place] [phone]       Medication List    STOP taking these medications       prenatal multivitamin Tabs      TAKE these medications       acetaminophen 160 MG/5ML solution  Commonly known as:  TYLENOL  Take 20.3 mLs (650 mg total) by mouth every 4 (four) hours as needed for fever.     ferrous gluconate 324 MG tablet  Commonly known as:  FERGON  Take 1 tablet (324 mg total) by mouth daily with breakfast.     HYDROcodone-acetaminophen 5-325 MG per tablet  Commonly known as:  NORCO/VICODIN  Take 1-2 tablets by mouth every 4 (four) hours as needed.  Follow-up Information   Follow up with CCS,MD, MD On 02/12/2013. (your appointment time is 10:20.  Be sure to arrive to your appointment 15 minutes early)    Contact information:   [address] [place] [address] [phone]        The results of significant diagnostics from this hospitalization (including imaging, microbiology, ancillary and laboratory) are listed below for reference.    Significant Diagnostic Studies: US Ob Limited  01/21/2013   OBSTETRICAL ULTRASOUND: This exam  was performed within a [facility]. The OB US report was generated in the AS system, and faxed to the ordering physician.   This report is also available in [employer] and in the [employer]. See AS Obstetric US report.  US Ob Limited  01/14/2013   OBSTETRICAL ULTRASOUND: This exam was performed within a [facility]. The OB US report was generated in the AS system, and faxed to the ordering physician.   This report is also available in [employer] and in the [employer]. See AS Obstetric US report.  US Fetal Bpp W/o Non Stress  01/14/2013   OBSTETRICAL ULTRASOUND: This exam was performed within a [facility]. The OB US report was generated in the AS system, and faxed to the ordering physician.   This report is also available in [employer] and in the [employer]. See AS Obstetric US report.  Ir Angiogram Pelvis Selective Or Supraselective  01/23/2013   *RADIOLOGY REPORT*  Clinical Data: Post C-section hemorrhage, post hysterectomy with persistent abdominal and pelvic bleeding.  Post open abdominal compartment decompression with packing at which time the patient was found to have a large left-sided the pelvic side wall hematoma. The patient is hemodynamically unstable, currently receiving blood products and on vasopressors.  The patient transferred emergently from the operating room for a pelvic arteriogram.  1.  ULTRASOUND GUIDANCE FOR ARTERIAL ACCESS 2.  LEFT INTERNAL ILIAC ARTERIOGRAM 3.  MIDDLE RECTAL ARTERIOGRAM (3rd ORDER) WITH PERCUTANEOUS COIL EMBOLIZATION  Comparison: None  Intravenous Medications: None - the patient was brought emergently from the operating room under general anesthesia  Contrast: 60 ml Omnipaque-300  Fluoroscopy Time: 7 minutes, 6 seconds  Access:  Right common femoral artery; hemostasis achieved with deployment of an Exoseal device.  Complications:  None immediate  Technique:  Informed written consent was obtained from the the patient's husband via the use of a Medical interpreter after a discussion of the risks, benefits and alternatives to treatment.  Questions regarding the procedure were encouraged and answered.  A timeout was performed prior to the initiation of the procedure.  The right groin was prepped and draped in the usual sterile fashion, and a sterile drape was applied covering the operative field.  Maximum barrier sterile technique with sterile gowns and gloves were used for the procedure.  A timeout was performed prior to the initiation of the procedure.  Local anesthesia was provided with 1% lidocaine.  The right femoral head was marked fluoroscopically.  Under ultrasound guidance, the right common femoral artery was accessed with a micropuncture kit after the overlying soft tissues were anesthetized with 1% lidocaine.  An ultrasound image was saved for documentation purposes.  The micropuncture sheath was exchanged for a 5 [place] vascular sheath over a Bentson wire.  A closure arteriogram was performed through the side of the sheath confirming access within the right common femoral artery.  Over a Bentson wire, a C2 catheter was  advanced to the pelvic bifurcation where it was back bled and flushed.  The C2 catheter was then utilized to select the contralateral left internal iliac artery and a left internal iliac arteriogram was performed.  With the use of a synchro-14 microwire, a regular renegade microcatheter was advanced into the distal aspect middle rectal artery to the origin of the extravasation/pseudoaneurysm.  A 5 mm diameter interlock coil was then deployed within the central aspect of the pseudoaneurysm.  Multiple overlapping 2 and 3 mm diameter interlock coils were then deployed along the distal length of the middle rectal artery.  The microcatheter was retracted into the more central aspect of the middle rectal artery and a  completion arteriogram was performed.  At this point, the procedure was terminated.  All wires catheters and sheaths removed from the patient.  Hemostasis was achieved at the right groin access site with deployment of an Exoseal closure device.  Hemostasis was achieved with manual compression.  A dressing was placed.  Findings:  Left internal iliac arteriogram demonstrates a focal area of extravasation/pseudoaneurysm formation originating from the distal aspect of the anterior division of the left internal iliac artery, presumably the middle rectal artery.  A microcatheter was advanced to the origin of the extravasation/pseudoaneurysm.  Multiple coils were utilized to occlude the neck of the pseudoaneurysm as well as the distal aspect of the feeding extravasating vessel.  Completion arteriogram was negative for an additional area of discrete vessel irregularity or extravasation.  Impression:  Technically successful coil embolization of a focal area of active extravastaion/pseudoaneurysm arising from the distal aspect of a branch of the anterior division of the left internal iliac artery, presumably the middle rectal artery.   Original Report Authenticated By: Tacey Ruiz, MD   Ir Angiogram Selective Each Additional Vessel  01/23/2013   *RADIOLOGY REPORT*  Clinical Data: Post C-section hemorrhage, post hysterectomy with persistent abdominal and pelvic bleeding.  Post open abdominal compartment decompression with packing at which time the patient was found to have a large left-sided the pelvic side wall hematoma. The patient is hemodynamically unstable, currently receiving blood products and on vasopressors.  The patient transferred emergently from the operating room for a pelvic arteriogram.  1.  ULTRASOUND GUIDANCE FOR ARTERIAL ACCESS 2.  LEFT INTERNAL ILIAC ARTERIOGRAM 3.  MIDDLE RECTAL ARTERIOGRAM (3rd ORDER) WITH PERCUTANEOUS COIL EMBOLIZATION  Comparison: None  Intravenous Medications: None - the patient was  brought emergently from the operating room under general anesthesia  Contrast: 60 ml Omnipaque-300  Fluoroscopy Time: 7 minutes, 6 seconds  Access:  Right common femoral artery; hemostasis achieved with deployment of an Exoseal device.  Complications: None immediate  Technique:  Informed written consent was obtained from the the patient's husband via the use of a Medical interpreter after a discussion of the risks, benefits and alternatives to treatment.  Questions regarding the procedure were encouraged and answered.  A timeout was performed prior to the initiation of the procedure.  The right groin was prepped and draped in the usual sterile fashion, and a sterile drape was applied covering the operative field.  Maximum barrier sterile technique with sterile gowns and gloves were used for the procedure.  A timeout was performed prior to the initiation of the procedure.  Local anesthesia was provided with 1% lidocaine.  The right femoral head was marked fluoroscopically.  Under ultrasound guidance, the right common femoral artery was accessed with a micropuncture kit after the overlying soft tissues were anesthetized with 1% lidocaine.  An  ultrasound image was saved for documentation purposes.  The micropuncture sheath was exchanged for a 5 [place] vascular sheath over a Bentson wire.  A closure arteriogram was performed through the side of the sheath confirming access within the right common femoral artery.  Over a Bentson wire, a C2 catheter was advanced to the pelvic bifurcation where it was back bled and flushed.  The C2 catheter was then utilized to select the contralateral left internal iliac artery and a left internal iliac arteriogram was performed.  With the use of a synchro-14 microwire, a regular renegade microcatheter was advanced into the distal aspect middle rectal artery to the origin of the extravasation/pseudoaneurysm.  A 5 mm diameter interlock coil was then deployed within the central aspect of  the pseudoaneurysm.  Multiple overlapping 2 and 3 mm diameter interlock coils were then deployed along the distal length of the middle rectal artery.  The microcatheter was retracted into the more central aspect of the middle rectal artery and a completion arteriogram was performed.  At this point, the procedure was terminated.  All wires catheters and sheaths removed from the patient.  Hemostasis was achieved at the right groin access site with deployment of an Exoseal closure device.  Hemostasis was achieved with manual compression.  A dressing was placed.  Findings:  Left internal iliac arteriogram demonstrates a focal area of extravasation/pseudoaneurysm formation originating from the distal aspect of the anterior division of the left internal iliac artery, presumably the middle rectal artery.  A microcatheter was advanced to the origin of the extravasation/pseudoaneurysm.  Multiple coils were utilized to occlude the neck of the pseudoaneurysm as well as the distal aspect of the feeding extravasating vessel.  Completion arteriogram was negative for an additional area of discrete vessel irregularity or extravasation.  Impression:  Technically successful coil embolization of a focal area of active extravastaion/pseudoaneurysm arising from the distal aspect of a branch of the anterior division of the left internal iliac artery, presumably the middle rectal artery.   Original Report Authenticated By: Tacey Ruiz, MD   Ir Angiogram Follow Up Study  01/23/2013   *RADIOLOGY REPORT*  Clinical Data: Post C-section hemorrhage, post hysterectomy with persistent abdominal and pelvic bleeding.  Post open abdominal compartment decompression with packing at which time the patient was found to have a large left-sided the pelvic side wall hematoma. The patient is hemodynamically unstable, currently receiving blood products and on vasopressors.  The patient transferred emergently from the operating room for a pelvic  arteriogram.  1.  ULTRASOUND GUIDANCE FOR ARTERIAL ACCESS 2.  LEFT INTERNAL ILIAC ARTERIOGRAM 3.  MIDDLE RECTAL ARTERIOGRAM (3rd ORDER) WITH PERCUTANEOUS COIL EMBOLIZATION  Comparison: None  Intravenous Medications: None - the patient was brought emergently from the operating room under general anesthesia  Contrast: 60 ml Omnipaque-300  Fluoroscopy Time: 7 minutes, 6 seconds  Access:  Right common femoral artery; hemostasis achieved with deployment of an Exoseal device.  Complications: None immediate  Technique:  Informed written consent was obtained from the the patient's husband via the use of a Medical interpreter after a discussion of the risks, benefits and alternatives to treatment.  Questions regarding the procedure were encouraged and answered.  A timeout was performed prior to the initiation of the procedure.  The right groin was prepped and draped in the usual sterile fashion, and a sterile drape was applied covering the operative field.  Maximum barrier sterile technique with sterile gowns and gloves were used for the procedure.  A timeout was  performed prior to the initiation of the procedure.  Local anesthesia was provided with 1% lidocaine.  The right femoral head was marked fluoroscopically.  Under ultrasound guidance, the right common femoral artery was accessed with a micropuncture kit after the overlying soft tissues were anesthetized with 1% lidocaine.  An ultrasound image was saved for documentation purposes.  The micropuncture sheath was exchanged for a 5 [place] vascular sheath over a Bentson wire.  A closure arteriogram was performed through the side of the sheath confirming access within the right common femoral artery.  Over a Bentson wire, a C2 catheter was advanced to the pelvic bifurcation where it was back bled and flushed.  The C2 catheter was then utilized to select the contralateral left internal iliac artery and a left internal iliac arteriogram was performed.  With the use of a  synchro-14 microwire, a regular renegade microcatheter was advanced into the distal aspect middle rectal artery to the origin of the extravasation/pseudoaneurysm.  A 5 mm diameter interlock coil was then deployed within the central aspect of the pseudoaneurysm.  Multiple overlapping 2 and 3 mm diameter interlock coils were then deployed along the distal length of the middle rectal artery.  The microcatheter was retracted into the more central aspect of the middle rectal artery and a completion arteriogram was performed.  At this point, the procedure was terminated.  All wires catheters and sheaths removed from the patient.  Hemostasis was achieved at the right groin access site with deployment of an Exoseal closure device.  Hemostasis was achieved with manual compression.  A dressing was placed.  Findings:  Left internal iliac arteriogram demonstrates a focal area of extravasation/pseudoaneurysm formation originating from the distal aspect of the anterior division of the left internal iliac artery, presumably the middle rectal artery.  A microcatheter was advanced to the origin of the extravasation/pseudoaneurysm.  Multiple coils were utilized to occlude the neck of the pseudoaneurysm as well as the distal aspect of the feeding extravasating vessel.  Completion arteriogram was negative for an additional area of discrete vessel irregularity or extravasation.  Impression:  Technically successful coil embolization of a focal area of active extravastaion/pseudoaneurysm arising from the distal aspect of a branch of the anterior division of the left internal iliac artery, presumably the middle rectal artery.   Original Report Authenticated By: Tacey Ruiz, MD   Ir US Guide Vasc Access Right  01/23/2013   *RADIOLOGY REPORT*  Clinical Data: Post C-section hemorrhage, post hysterectomy with persistent abdominal and pelvic bleeding.  Post open abdominal compartment decompression with packing at which time the patient was  found to have a large left-sided the pelvic side wall hematoma. The patient is hemodynamically unstable, currently receiving blood products and on vasopressors.  The patient transferred emergently from the operating room for a pelvic arteriogram.  1.  ULTRASOUND GUIDANCE FOR ARTERIAL ACCESS 2.  LEFT INTERNAL ILIAC ARTERIOGRAM 3.  MIDDLE RECTAL ARTERIOGRAM (3rd ORDER) WITH PERCUTANEOUS COIL EMBOLIZATION  Comparison: None  Intravenous Medications: None - the patient was brought emergently from the operating room under general anesthesia  Contrast: 60 ml Omnipaque-300  Fluoroscopy Time: 7 minutes, 6 seconds  Access:  Right common femoral artery; hemostasis achieved with deployment of an Exoseal device.  Complications: None immediate  Technique:  Informed written consent was obtained from the the patient's husband via the use of a Medical interpreter after a discussion of the risks, benefits and alternatives to treatment.  Questions regarding the procedure were encouraged and answered.  A  timeout was performed prior to the initiation of the procedure.  The right groin was prepped and draped in the usual sterile fashion, and a sterile drape was applied covering the operative field.  Maximum barrier sterile technique with sterile gowns and gloves were used for the procedure.  A timeout was performed prior to the initiation of the procedure.  Local anesthesia was provided with 1% lidocaine.  The right femoral head was marked fluoroscopically.  Under ultrasound guidance, the right common femoral artery was accessed with a micropuncture kit after the overlying soft tissues were anesthetized with 1% lidocaine.  An ultrasound image was saved for documentation purposes.  The micropuncture sheath was exchanged for a 5 [place] vascular sheath over a Bentson wire.  A closure arteriogram was performed through the side of the sheath confirming access within the right common femoral artery.  Over a Bentson wire, a C2 catheter was  advanced to the pelvic bifurcation where it was back bled and flushed.  The C2 catheter was then utilized to select the contralateral left internal iliac artery and a left internal iliac arteriogram was performed.  With the use of a synchro-14 microwire, a regular renegade microcatheter was advanced into the distal aspect middle rectal artery to the origin of the extravasation/pseudoaneurysm.  A 5 mm diameter interlock coil was then deployed within the central aspect of the pseudoaneurysm.  Multiple overlapping 2 and 3 mm diameter interlock coils were then deployed along the distal length of the middle rectal artery.  The microcatheter was retracted into the more central aspect of the middle rectal artery and a completion arteriogram was performed.  At this point, the procedure was terminated.  All wires catheters and sheaths removed from the patient.  Hemostasis was achieved at the right groin access site with deployment of an Exoseal closure device.  Hemostasis was achieved with manual compression.  A dressing was placed.  Findings:  Left internal iliac arteriogram demonstrates a focal area of extravasation/pseudoaneurysm formation originating from the distal aspect of the anterior division of the left internal iliac artery, presumably the middle rectal artery.  A microcatheter was advanced to the origin of the extravasation/pseudoaneurysm.  Multiple coils were utilized to occlude the neck of the pseudoaneurysm as well as the distal aspect of the feeding extravasating vessel.  Completion arteriogram was negative for an additional area of discrete vessel irregularity or extravasation.  Impression:  Technically successful coil embolization of a focal area of active extravastaion/pseudoaneurysm arising from the distal aspect of a branch of the anterior division of the left internal iliac artery, presumably the middle rectal artery.   Original Report Authenticated By: Tacey Ruiz, MD   Dg Chest Portable 1  View  01/28/2013   *RADIOLOGY REPORT*  Clinical Data: Respiratory failure.  PORTABLE CHEST - 1 VIEW  Comparison: 01/27/2013  Findings: The endotracheal tube has been removed.  Central line shows stable positioning.  There is increased atelectasis of the right lower lung.  Stable consolidation of the left lower lobe. The heart size is within normal limits.  IMPRESSION: Increased atelectasis of the right lower lung after extubation.   Original Report Authenticated By: Irish Lack, M.D.   Dg Chest Port 1 View  01/27/2013   *RADIOLOGY REPORT*  Clinical Data: Evaluate ET tube  PORTABLE CHEST - 1 VIEW  Comparison: 01/26/2013  Findings: Endotracheal tube tip is above the carina. Nasogastric tube is in the stomach.  There is a left IJ catheter with tip in the projection of the SVC.  Heart size appears normal.  There are bilateral pleural effusions identified left greater than right. Compared with previous exam there has been improved appearance of the right effusion.  IMPRESSION:  1. Decrease in right pleural effusion.   Original Report Authenticated By: Signa Kell, M.D.   Dg Chest Port 1 View  01/26/2013   *RADIOLOGY REPORT*  Clinical Data: Follow up pulmonary infiltrates  PORTABLE CHEST - 1 VIEW  Comparison: Portable exam 0632 hours compared to 01/23/2013  Findings: Tip of endotracheal tube 2.9 cm above carina. Nasogastric tube ascends into stomach. Left jugular central venous catheter tip projects over SVC. Numerous cardiac monitoring leads project over chest. Enlargement of cardiac silhouette. Mediastinal contours and pulmonary vascularity normal for technique. Left lower lobe atelectasis versus consolidation persists. Minimal atelectasis right base. Upper lungs clear. Cannot exclude small left pleural effusion. No pneumothorax.  IMPRESSION: Atelectasis at right base with persistent atelectasis versus consolidation in left lower lobe.   Original Report Authenticated By: Ulyses Southward, M.D.   Dg Chest Port 1  View  01/26/2013   *RADIOLOGY REPORT*  Clinical Data: Respiratory failure, postop.  PORTABLE CHEST - 1 VIEW  Comparison: 01/25/2013  Findings: Endotracheal tube is 4 cm above the carina.  The left jugular central venous catheter tip is in the upper SVC region. Nasogastric tube extends into the abdomen.  Persistent basilar lung densities are suggestive for pleural fluid and atelectasis.  Heart size is stable.  Negative for a pneumothorax.  IMPRESSION: Stable basilar densities are suggestive for pleural effusions and atelectasis.  Support apparatuses as described.   Original Report Authenticated By: Richarda Overlie, M.D.   Dg Chest Port 1 View  01/25/2013   *RADIOLOGY REPORT*  Clinical Data: Endotracheal tube position.  PORTABLE CHEST - 1 VIEW  Comparison: 01/24/2013.  Findings: The endotracheal tube is in good position, unchanged. The left IJ catheter and NG tubes are stable.  The heart is mildly enlarged but unchanged.  Persistent bibasilar atelectasis or infiltrates and small effusions.  IMPRESSION:  1.  Stable support apparatus. 2.  Persistent effusions and bibasilar atelectasis or infiltrates.   Original Report Authenticated By: Rudie Meyer, M.D.   Portable Chest Xray  01/23/2013   *RADIOLOGY REPORT*  Clinical Data: Endotracheal tube placement  PORTABLE CHEST - 1 VIEW  Comparison: 01/22/2013  Findings:  Grossly unchanged cardiac silhouette and mediastinal contours. Interval intubation with endotracheal tube overlying the tracheal air column with tip approximately 3 cm above the carina.  Enteric tube tip and side port project below the left hemidiaphragm.  An esophageal pH probe projects over the thoracic inlet.  Stable positioning of left jugular approach central venous catheter with tip overlying the mid SVC.  No supine evidence of pneumothorax or pleural effusion.  Worsening left basilar/retrocardiac heterogeneous / consolidative opacities.  No definite evidence of edema.  Unchanged bones.  IMPRESSION: 1.   Appropriately positioned support apparatus as above.  No supine evidence of pneumothorax. 2.  Worsening left basilar/retrocardiac opacity, favored to represent atelectasis.   Original Report Authenticated By: Tacey Ruiz, MD   Dg Chest Port 1 View  01/22/2013   *RADIOLOGY REPORT*  Clinical Data: Central line placement  PORTABLE CHEST - 1 VIEW  Comparison: None.  Findings: Left IJ central line tip terminates over the mid SVC. Lung volumes are low with crowding of the bronchovascular markings. Patchy retrocardiac airspace opacity is noted.  Heart size is normal.  No pleural effusion. No pneumothorax.  IMPRESSION: Appropriately positioned left IJ central line.  Patchy left  greater than right airspace opacity, likely atelectasis.  Aspiration or early pneumonia at the left lung base could have a similar appearance.   Original Report Authenticated By: Christiana Pellant, M.D.   Dg Abd Portable 1v  01/28/2013   *RADIOLOGY REPORT*  Clinical Data: Ileus.  Abdominal distention.  PORTABLE ABDOMEN - 1 VIEW  Comparison: 01/25/2013  Findings: Improvement in bowel gas distention since prior study. Mild gaseous distention of transverse colon persists.  NG tube is in the stomach.  No visible free air.  No organomegaly or suspicious calcification.  IMPRESSION: Improving ileus pattern.   Original Report Authenticated By: Charlett Nose, M.D.   Dg Abd Portable 1v  01/25/2013   *RADIOLOGY REPORT*  Clinical Data: Abnormal sponge count during laparotomy.  History of a ruptured uterus.  PORTABLE ABDOMEN - 1 VIEW  Comparison: None.  Findings: An NG tube is in the stomach.  The bowel is dilated and centralized.  No definite radiopaque sponges are identified in the abdomen. Coils are noted in the pelvis.  IMPRESSION: No radiopaque sponges are identified.   Original Report Authenticated By: Rudie Meyer, M.D.   [facility] Guide Roadmapping  01/23/2013   *RADIOLOGY REPORT*  Clinical Data: Post C-section  hemorrhage, post hysterectomy with persistent abdominal and pelvic bleeding.  Post open abdominal compartment decompression with packing at which time the patient was found to have a large left-sided the pelvic side wall hematoma. The patient is hemodynamically unstable, currently receiving blood products and on vasopressors.  The patient transferred emergently from the operating room for a pelvic arteriogram.  1.  ULTRASOUND GUIDANCE FOR ARTERIAL ACCESS 2.  LEFT INTERNAL ILIAC ARTERIOGRAM 3.  MIDDLE RECTAL ARTERIOGRAM (3rd ORDER) WITH PERCUTANEOUS COIL EMBOLIZATION  Comparison: None  Intravenous Medications: None - the patient was brought emergently from the operating room under general anesthesia  Contrast: 60 ml Omnipaque-300  Fluoroscopy Time: 7 minutes, 6 seconds  Access:  Right common femoral artery; hemostasis achieved with deployment of an Exoseal device.  Complications: None immediate  Technique:  Informed written consent was obtained from the the patient's husband via the use of a Medical interpreter after a discussion of the risks, benefits and alternatives to treatment.  Questions regarding the procedure were encouraged and answered.  A timeout was performed prior to the initiation of the procedure.  The right groin was prepped and draped in the usual sterile fashion, and a sterile drape was applied covering the operative field.  Maximum barrier sterile technique with sterile gowns and gloves were used for the procedure.  A timeout was performed prior to the initiation of the procedure.  Local anesthesia was provided with 1% lidocaine.  The right femoral head was marked fluoroscopically.  Under ultrasound guidance, the right common femoral artery was accessed with a micropuncture kit after the overlying soft tissues were anesthetized with 1% lidocaine.  An ultrasound image was saved for documentation purposes.  The micropuncture sheath was exchanged for a 5 [place] vascular sheath over a Bentson wire.  A  closure arteriogram was performed through the side of the sheath confirming access within the right common femoral artery.  Over a Bentson wire, a C2 catheter was advanced to the pelvic bifurcation where it was back bled and flushed.  The C2 catheter was then utilized to select the contralateral left internal iliac artery and a left internal iliac arteriogram was performed.  With the use of a synchro-14 microwire, a regular renegade microcatheter was advanced into  the distal aspect middle rectal artery to the origin of the extravasation/pseudoaneurysm.  A 5 mm diameter interlock coil was then deployed within the central aspect of the pseudoaneurysm.  Multiple overlapping 2 and 3 mm diameter interlock coils were then deployed along the distal length of the middle rectal artery.  The microcatheter was retracted into the more central aspect of the middle rectal artery and a completion arteriogram was performed.  At this point, the procedure was terminated.  All wires catheters and sheaths removed from the patient.  Hemostasis was achieved at the right groin access site with deployment of an Exoseal closure device.  Hemostasis was achieved with manual compression.  A dressing was placed.  Findings:  Left internal iliac arteriogram demonstrates a focal area of extravasation/pseudoaneurysm formation originating from the distal aspect of the anterior division of the left internal iliac artery, presumably the middle rectal artery.  A microcatheter was advanced to the origin of the extravasation/pseudoaneurysm.  Multiple coils were utilized to occlude the neck of the pseudoaneurysm as well as the distal aspect of the feeding extravasating vessel.  Completion arteriogram was negative for an additional area of discrete vessel irregularity or extravasation.  Impression:  Technically successful coil embolization of a focal area of active extravastaion/pseudoaneurysm arising from the distal aspect of a branch of the anterior  division of the left internal iliac artery, presumably the middle rectal artery.   Original Report Authenticated By: Tacey Ruiz, MD    Microbiology: Recent Results (from the past 240 hour(s))  MRSA PCR SCREENING     Status: None   Collection Time    01/23/13  9:53 AM      Result Value Range Status   MRSA by PCR NEGATIVE  NEGATIVE Final   Comment:            The GeneXpert MRSA Assay (FDA     approved for NASAL specimens     only), is one component of a     comprehensive MRSA colonization     surveillance program. It is not     intended to diagnose MRSA     infection nor to guide or     monitor treatment for     MRSA infections.  CULTURE, BLOOD (ROUTINE X 2)     Status: None   Collection Time    01/23/13  8:45 PM      Result Value Range Status   Specimen Description BLOOD RIGHT ARM   Final   Special Requests BOTTLES DRAWN AEROBIC ONLY 5CC   Final   Culture  Setup Time 01/24/2013 12:42   Final   Culture NO GROWTH 5 DAYS   Final   Report Status 01/30/2013 FINAL   Final  CULTURE, BLOOD (ROUTINE X 2)     Status: None   Collection Time    01/23/13  8:50 PM      Result Value Range Status   Specimen Description BLOOD RIGHT ARM   Final   Special Requests BOTTLES DRAWN AEROBIC ONLY 2CC   Final   Culture  Setup Time 01/24/2013 12:42   Final   Culture NO GROWTH 5 DAYS   Final   Report Status 01/30/2013 FINAL   Final  CULTURE, RESPIRATORY (NON-EXPECTORATED)     Status: None   Collection Time    01/24/13  3:50 AM      Result Value Range Status   Specimen Description TRACHEAL ASPIRATE   Final   Special Requests NONE   Final  Gram Stain     Final   Value: RARE WBC PRESENT, PREDOMINANTLY MONONUCLEAR     NO SQUAMOUS EPITHELIAL CELLS SEEN     NO ORGANISMS SEEN   Culture Non-Pathogenic Oropharyngeal-type Flora Isolated.   Final   Report Status 01/26/2013 FINAL   Final     Labs: Basic Metabolic Panel:  Recent Labs Lab 01/25/13 1847 01/26/13 0400 01/27/13 0428 01/28/13 0500  01/28/13 2000 01/29/13 0456  NA 138 139 136 135  --  133*  K 4.5 4.0 3.4* 3.5  --  3.6  CL 105 105 100 99  --  97  CO2 22 23 24 22   --  25  GLUCOSE 86 88 266* 82  --  104*  BUN 17 15 7  5*  --  10  CREATININE 0.64 0.60 0.48* 0.37*  --  0.38*  CALCIUM 7.6* 7.4* 7.1* 7.6*  --  7.6*  MG  --   --   --  1.4* 2.2 1.9  PHOS  --   --   --  2.5  --  2.5   Liver Function Tests:  Recent Labs Lab 01/27/13 0428  AST 31  ALT 27  ALKPHOS 49  BILITOT 0.6  PROT 4.1*  ALBUMIN 1.3*   No results found for this basename: LIPASE, AMYLASE,  in the last 168 hours No results found for this basename: AMMONIA,  in the last 168 hours CBC:  Recent Labs Lab 01/26/13 2000 01/27/13 0428 01/28/13 0430 01/29/13 0456 01/31/13 0452  WBC 21.6* 20.4* 24.8* 24.5* 16.2*  NEUTROABS  --  15.7*  --   --   --   HGB 7.0* 6.7* 8.6* 9.1* 9.5*  HCT 20.8* 20.4* 25.5* 27.1* 28.3*  MCV 89.3 89.1 87.9 86.9 90.4  PLT 143* 152 206 342 462*   CBG:  Recent Labs Lab 01/30/13 1724 01/30/13 2008 01/30/13 2355 01/31/13 0402 01/31/13 0802  GLUCAP 115* 95 96 91 85    Principal Problem:   Hemorrhagic shock due to intraoperative and postoperative bleeding Active Problems:   Language barrier; cultural differences; only speaks [place]ese   S/P cesarean hysterectomy due to posterior uterine rupture and massive hemorrhage   Retroperitoneal hematoma   Consumptive coagulopathy due to intraoperative and postoperative bleeding   Postoperative respiratory failure   Iliac artery bleed   Time coordinating discharge: 30 mins  Signed:  Aqil Goetting, ANP-BC

## 2013-02-01 NOTE — Progress Notes (Signed)
Physical Therapy Treatment Patient Details Name: Mackenzie [name] MRN: [id number] DOB: May 07, 1986 Today's Date: 02/01/2013 Time: 4098-1191 PT Time Calculation (min): 14 min  PT Assessment / Plan / Recommendation Comments on Treatment Session  PRogressing well. Should not need HHPT. Will update discharge plans    Follow Up Recommendations  No PT follow up     Does the patient have the potential to tolerate intense rehabilitation     Barriers to Discharge        Equipment Recommendations       Recommendations for Other Services    Frequency Min 3X/week   Plan Discharge plan remains appropriate;Frequency remains appropriate    Precautions / Restrictions Precautions Precautions: Fall   Pertinent Vitals/Pain     Mobility  Bed Mobility Supine to Sit: 6: Modified independent (Device/Increase time) Transfers Sit to Stand: 6: Modified independent (Device/Increase time) Stand to Sit: 6: Modified independent (Device/Increase time) Ambulation/Gait Ambulation/Gait Assistance: 5: Supervision Ambulation Distance (Feet): 150 Feet Assistive device: None Ambulation/Gait Assistance Details: Patient guarded and cautious with ambulation but moving well. Supervision for safety Gait Pattern: Within Functional Limits    Exercises     PT Diagnosis:    PT Problem List:   PT Treatment Interventions:     PT Goals Acute Rehab PT Goals PT Goal: Supine/Side to Sit - Progress: Met PT Goal: Sit to Supine/Side - Progress: Met PT Goal: Sit to Stand - Progress: Met PT Goal: Stand to Sit - Progress: Met PT Goal: Ambulate - Progress: Progressing toward goal  Visit Information  Last PT Received On: 02/01/13 Assistance Needed: +1    Subjective Data      Cognition  Cognition Arousal/Alertness: Awake/alert Behavior During Therapy: WFL for tasks assessed/performed Overall Cognitive Status: Within Functional Limits for tasks assessed    Balance     End of Session PT - End of Session Activity  Tolerance: Patient tolerated treatment well Patient left: in chair;with call bell/phone within reach   GP     Fredrich Birks 02/01/2013, 12:53 PM  02/01/2013 Fredrich Birks PTA [phone] pager [phone] office

## 2013-02-01 NOTE — Progress Notes (Signed)
5 Days Post-Op  Subjective: Pt doing well, pain well controlled.  Having BM's and urinating on own.  Ambulating some with PT.  Tolerating regular diet.    Objective: Vital signs in last 24 hours: Temp:  [97.5 F (36.4 C)-98.1 F (36.7 C)] 98.1 F (36.7 C) (06/02 0510) Pulse Rate:  [80-82] 81 (06/02 0510) Resp:  [17-18] 17 (06/02 0510) BP: (92-102)/(63-69) 92/69 mmHg (06/02 0510) SpO2:  [99 %-100 %] 100 % (06/02 0510) Last BM Date: 01/30/13  Intake/Output from previous day: 06/01 0701 - 06/02 0700 In: 720 [P.O.:720] Out: -  Intake/Output this shift:    PE: Gen:  Alert, NAD, pleasant Abd: Soft, mild tenderness, ND, +BS, no HSM, wound C/D, 1/2 staples intact, where skin is open appears healthy, no purulent drainage   Lab Results:   Recent Labs  01/31/13 0452  WBC 16.2*  HGB 9.5*  HCT 28.3*  PLT 462*   BMET No results found for this basename: NA, K, CL, CO2, GLUCOSE, BUN, CREATININE, CALCIUM,  in the last 72 hours PT/INR No results found for this basename: LABPROT, INR,  in the last 72 hours CMP     Component Value Date/Time   NA 133* 01/29/2013 0456   K 3.6 01/29/2013 0456   CL 97 01/29/2013 0456   CO2 25 01/29/2013 0456   GLUCOSE 104* 01/29/2013 0456   BUN 10 01/29/2013 0456   CREATININE 0.38* 01/29/2013 0456   CALCIUM 7.6* 01/29/2013 0456   PROT 4.1* 01/27/2013 0428   ALBUMIN 1.3* 01/27/2013 0428   AST 31 01/27/2013 0428   ALT 27 01/27/2013 0428   ALKPHOS 49 01/27/2013 0428   BILITOT 0.6 01/27/2013 0428   GFRNONAA >90 01/29/2013 0456   GFRAA >90 01/29/2013 0456   Lipase  No results found for this basename: lipase       Studies/Results: No results found.  Anti-infectives: Anti-infectives   Start     Dose/Rate Route Frequency Ordered Stop   01/23/13 1100  piperacillin-tazobactam (ZOSYN) IVPB 3.375 g  Status:  Discontinued     3.375 g 12.5 mL/hr over 240 Minutes Intravenous Every 8 hours 01/23/13 1031 01/27/13 1304   01/22/13 1800  piperacillin-tazobactam  (ZOSYN) IVPB 3.375 g  Status:  Discontinued     3.375 g 12.5 mL/hr over 240 Minutes Intravenous Every 8 hours 01/22/13 1709 01/23/13 1031   01/22/13 1200  gentamicin (GARAMYCIN) 140 mg in dextrose 5 % 50 mL IVPB  Status:  Discontinued     140 mg 107 mL/hr over 30 Minutes Intravenous Every 8 hours 01/22/13 0346 01/22/13 1709   01/22/13 0415  gentamicin (GARAMYCIN) 150 mg in dextrose 5 % 50 mL IVPB     150 mg 107.5 mL/hr over 30 Minutes Intravenous  Once 01/22/13 0343 01/22/13 0440   01/22/13 0345  ampicillin (OMNIPEN) 2 g in sodium chloride 0.9 % 50 mL IVPB  Status:  Discontinued     2 g 150 mL/hr over 20 Minutes Intravenous Every 6 hours 01/22/13 0325 01/22/13 1709       Assessment/Plan POD#9-Exploratory laparotomy, intra-abdominal packing, vac placement  POD#7-Removal of VAC sponge,exploratory laparotomy, placement of VAC sponge  POD#5-Vac and sponge removal x 1, abd washout and evacuation of retroperitoneal hematoma, abd wall closure   Plan: Reg diet, start to mobilize, PO narctics, Cont PT to make sure she will not need home health, dressing changes q day.  Half of staples are in place, may need wet to dry dressings, will check with Dr. Luisa Hart about discharge  plans, may be today.  Will look for PT's evaluation today prior to d/c.  Would appreciate GYN to make arrangements for follow ups prior to discharge.     LOS: 11 days    [name], [name] 02/01/2013, 9:14 AM Pager: [phone]

## 2013-02-02 NOTE — Progress Notes (Signed)
Agree with updated d/c plans.  Aquila Delaughter, PT DPT [phone]  

## 2013-02-02 NOTE — Discharge Summary (Signed)
Looks great.  Discharge.

## 2013-02-08 ENCOUNTER — Other Ambulatory Visit (INDEPENDENT_AMBULATORY_CARE_PROVIDER_SITE_OTHER): Payer: Medicaid Other

## 2013-02-08 DIAGNOSIS — N390 Urinary tract infection, site not specified: Secondary | ICD-10-CM

## 2013-02-08 LAB — POCT URINALYSIS DIP (DEVICE)
Ketones, ur: NEGATIVE mg/dL
Protein, ur: NEGATIVE mg/dL
Specific Gravity, Urine: 1.01 (ref 1.005–1.030)
Urobilinogen, UA: 0.2 mg/dL (ref 0.0–1.0)
pH: 7 (ref 5.0–8.0)

## 2013-02-08 MED ORDER — NITROFURANTOIN MONOHYD MACRO 100 MG PO CAPS: 100.0000 mg | ORAL_CAPSULE | Freq: Two times a day (BID) | ORAL | Status: DC

## 2013-02-08 NOTE — Progress Notes (Signed)
Pt came in for possible UTI and UA ran resulted with moderate blood.  Prescribed Macrobid 100 mg per protocol.  Verified with pt if she has any allergies and also verified pharmacy.  I advised pt to take treatment unless something else comes in on her culture that would need different treatment.  Pt stated understanding and did not have any other questions.

## 2013-02-10 LAB — URINE CULTURE

## 2013-02-11 ENCOUNTER — Ambulatory Visit (INDEPENDENT_AMBULATORY_CARE_PROVIDER_SITE_OTHER): Payer: Medicaid Other | Admitting: Obstetrics & Gynecology

## 2013-02-11 ENCOUNTER — Encounter: Payer: Self-pay | Admitting: Obstetrics & Gynecology

## 2013-02-11 VITALS — BP 108/75 | HR 88 | Ht 61.0 in | Wt 123.9 lb

## 2013-02-11 DIAGNOSIS — K661 Hemoperitoneum: Secondary | ICD-10-CM

## 2013-02-11 DIAGNOSIS — R578 Other shock: Secondary | ICD-10-CM

## 2013-02-11 DIAGNOSIS — R58 Hemorrhage, not elsewhere classified: Secondary | ICD-10-CM

## 2013-02-11 DIAGNOSIS — Z609 Problem related to social environment, unspecified: Secondary | ICD-10-CM

## 2013-02-11 DIAGNOSIS — Z603 Acculturation difficulty: Secondary | ICD-10-CM

## 2013-02-11 DIAGNOSIS — Z09 Encounter for follow-up examination after completed treatment for conditions other than malignant neoplasm: Secondary | ICD-10-CM

## 2013-02-11 DIAGNOSIS — D65 Disseminated intravascular coagulation [defibrination syndrome]: Secondary | ICD-10-CM

## 2013-02-11 DIAGNOSIS — J95821 Acute postprocedural respiratory failure: Secondary | ICD-10-CM

## 2013-02-11 DIAGNOSIS — Z90710 Acquired absence of both cervix and uterus: Secondary | ICD-10-CM

## 2013-02-11 NOTE — Progress Notes (Signed)
GYNECOLOGY CLINIC PROGRESS NOTE  History:  27 y.o. [id number] here today for follow up after complicated postpartum course; see problem list below and previous notes for more details.  Patient Active Problem List   Diagnosis Date Noted  . Postoperative respiratory failure 01/24/2013  . Iliac artery distal branch bleed s/p IR coiling 01/24/2013  . Retroperitoneal hematoma 01/23/2013  . Consumptive coagulopathy due to intraoperative and postoperative bleeding 01/23/2013  . S/P cesarean hysterectomy due to posterior uterine rupture and massive hemorrhage 01/22/2013  . Hemorrhagic shock due to intraoperative and postoperative bleeding 01/22/2013  . Language barrier; cultural differences; only speaks Burmese 01/21/2013   Patient was discharged to home on PPD#10.  She has been seen by [facility] for followup and vertical incision wound management.  She is here with a Burmese interpreter.  She wants to discuss details of what happened during her complicated hospital stay.  Denies any vaginal bleeding.  She is breastfeeding, infant is doing well.  Husband and older son are also doing well.  The following portions of the patient's history were reviewed and updated as appropriate: allergies, current medications, past family history, past medical history, past social history, past surgical history and problem list.  Review of Systems:  Pertinent items are noted in HPI.  Objective:  Physical Exam BP 108/75  Pulse 88  Ht 5\' 1"  (1.549 m)  Wt 123 lb 14.4 oz (56.201 kg)  BMI 23.42 kg/m2  LMP 03/22/2012  Breastfeeding? No Gen: NAD Abd: Soft, nontender and nondistended.  Vertical incision with dressing in place.  Well-healing Pfannenstiel incision, no erythema, induration or drainage. Pelvic: Deferred until next visit  Labs and Imaging  Ir Angiogram Pelvis   01/23/2013   *RADIOLOGY REPORT*  Clinical Data: Post C-section hemorrhage, post hysterectomy with persistent  abdominal and pelvic bleeding.  Post open abdominal compartment decompression with packing at which time the patient was found to have a large left-sided the pelvic side wall hematoma. The patient is hemodynamically unstable, currently receiving blood products and on vasopressors.  The patient transferred emergently from the operating room for a pelvic arteriogram.  1.  ULTRASOUND GUIDANCE FOR ARTERIAL ACCESS 2.  LEFT INTERNAL ILIAC ARTERIOGRAM 3.  MIDDLE RECTAL ARTERIOGRAM (3rd ORDER) WITH PERCUTANEOUS COIL EMBOLIZATION  Comparison: None  Intravenous Medications: None - the patient was brought emergently from the operating room under general anesthesia  Contrast: 60 ml Omnipaque-300  Fluoroscopy Time: 7 minutes, 6 seconds  Access:  Right common femoral artery; hemostasis achieved with deployment of an Exoseal device.  Complications: None immediate  Technique:  Informed written consent was obtained from the the patient's husband via the use of a Medical interpreter after a discussion of the risks, benefits and alternatives to treatment.  Questions regarding the procedure were encouraged and answered.  A timeout was performed prior to the initiation of the procedure.  The right groin was prepped and draped in the usual sterile fashion, and a sterile drape was applied covering the operative field.  Maximum barrier sterile technique with sterile gowns and gloves were used for the procedure.  A timeout was performed prior to the initiation of the procedure.  Local anesthesia was provided with 1% lidocaine.  The right femoral head was marked fluoroscopically.  Under ultrasound guidance, the right common femoral artery was accessed with a micropuncture kit after the overlying soft tissues were anesthetized with 1% lidocaine.  An ultrasound image was saved for documentation purposes.  The micropuncture sheath was exchanged for a 5 [place]  vascular sheath over a Bentson wire.  A closure arteriogram was performed through the  side of the sheath confirming access within the right common femoral artery.  Over a Bentson wire, a C2 catheter was advanced to the pelvic bifurcation where it was back bled and flushed.  The C2 catheter was then utilized to select the contralateral left internal iliac artery and a left internal iliac arteriogram was performed.  With the use of a synchro-14 microwire, a regular renegade microcatheter was advanced into the distal aspect middle rectal artery to the origin of the extravasation/pseudoaneurysm.  A 5 mm diameter interlock coil was then deployed within the central aspect of the pseudoaneurysm.  Multiple overlapping 2 and 3 mm diameter interlock coils were then deployed along the distal length of the middle rectal artery.  The microcatheter was retracted into the more central aspect of the middle rectal artery and a completion arteriogram was performed.  At this point, the procedure was terminated.  All wires catheters and sheaths removed from the patient.  Hemostasis was achieved at the right groin access site with deployment of an Exoseal closure device.  Hemostasis was achieved with manual compression.  A dressing was placed.  Findings:  Left internal iliac arteriogram demonstrates a focal area of extravasation/pseudoaneurysm formation originating from the distal aspect of the anterior division of the left internal iliac artery, presumably the middle rectal artery.  A microcatheter was advanced to the origin of the extravasation/pseudoaneurysm.  Multiple coils were utilized to occlude the neck of the pseudoaneurysm as well as the distal aspect of the feeding extravasating vessel.  Completion arteriogram was negative for an additional area of discrete vessel irregularity or extravasation.  Impression:  Technically successful coil embolization of a focal area of active extravastaion/pseudoaneurysm arising from the distal aspect of a branch of the anterior division of the left internal iliac artery,  presumably the middle rectal artery.   Original Report Authenticated By: Tacey Ruiz, MD  Dg Chest Portable 1 View  01/28/2013   *RADIOLOGY REPORT*  Clinical Data: Respiratory failure.  PORTABLE CHEST - 1 VIEW  Comparison: 01/27/2013  Findings: The endotracheal tube has been removed.  Central line shows stable positioning.  There is increased atelectasis of the right lower lung.  Stable consolidation of the left lower lobe. The heart size is within normal limits.  IMPRESSION: Increased atelectasis of the right lower lung after extubation.   Original Report Authenticated By: Irish Lack, M.D.   Dg Chest Port 1 View  01/27/2013   *RADIOLOGY REPORT*  Clinical Data: Evaluate ET tube  PORTABLE CHEST - 1 VIEW  Comparison: 01/26/2013  Findings: Endotracheal tube tip is above the carina. Nasogastric tube is in the stomach.  There is a left IJ catheter with tip in the projection of the SVC.  Heart size appears normal.  There are bilateral pleural effusions identified left greater than right. Compared with previous exam there has been improved appearance of the right effusion.  IMPRESSION:  1. Decrease in right pleural effusion.   Original Report Authenticated By: Signa Kell, M.D.   Dg Chest Port 1 View  01/26/2013   *RADIOLOGY REPORT*  Clinical Data: Follow up pulmonary infiltrates  PORTABLE CHEST - 1 VIEW  Comparison: Portable exam 0632 hours compared to 01/23/2013  Findings: Tip of endotracheal tube 2.9 cm above carina. Nasogastric tube ascends into stomach. Left jugular central venous catheter tip projects over SVC. Numerous cardiac monitoring leads project over chest. Enlargement of cardiac silhouette. Mediastinal contours and pulmonary  vascularity normal for technique. Left lower lobe atelectasis versus consolidation persists. Minimal atelectasis right base. Upper lungs clear. Cannot exclude small left pleural effusion. No pneumothorax.  IMPRESSION: Atelectasis at right base with persistent atelectasis  versus consolidation in left lower lobe.   Original Report Authenticated By: Ulyses Southward, M.D.   Dg Chest Port 1 View  01/26/2013   *RADIOLOGY REPORT*  Clinical Data: Respiratory failure, postop.  PORTABLE CHEST - 1 VIEW  Comparison: 01/25/2013  Findings: Endotracheal tube is 4 cm above the carina.  The left jugular central venous catheter tip is in the upper SVC region. Nasogastric tube extends into the abdomen.  Persistent basilar lung densities are suggestive for pleural fluid and atelectasis.  Heart size is stable.  Negative for a pneumothorax.  IMPRESSION: Stable basilar densities are suggestive for pleural effusions and atelectasis.  Support apparatuses as described.   Original Report Authenticated By: Richarda Overlie, M.D.   Dg Chest Port 1 View  01/25/2013   *RADIOLOGY REPORT*  Clinical Data: Endotracheal tube position.  PORTABLE CHEST - 1 VIEW  Comparison: 01/24/2013.  Findings: The endotracheal tube is in good position, unchanged. The left IJ catheter and NG tubes are stable.  The heart is mildly enlarged but unchanged.  Persistent bibasilar atelectasis or infiltrates and small effusions.  IMPRESSION:  1.  Stable support apparatus. 2.  Persistent effusions and bibasilar atelectasis or infiltrates.   Original Report Authenticated By: Rudie Meyer, M.D.   Portable Chest Xray  01/23/2013   *RADIOLOGY REPORT*  Clinical Data: Endotracheal tube placement  PORTABLE CHEST - 1 VIEW  Comparison: 01/22/2013  Findings:  Grossly unchanged cardiac silhouette and mediastinal contours. Interval intubation with endotracheal tube overlying the tracheal air column with tip approximately 3 cm above the carina.  Enteric tube tip and side port project below the left hemidiaphragm.  An esophageal pH probe projects over the thoracic inlet.  Stable positioning of left jugular approach central venous catheter with tip overlying the mid SVC.  No supine evidence of pneumothorax or pleural effusion.  Worsening left  basilar/retrocardiac heterogeneous / consolidative opacities.  No definite evidence of edema.  Unchanged bones.  IMPRESSION: 1.  Appropriately positioned support apparatus as above.  No supine evidence of pneumothorax. 2.  Worsening left basilar/retrocardiac opacity, favored to represent atelectasis.   Original Report Authenticated By: Tacey Ruiz, MD   Dg Chest Port 1 View  01/22/2013   *RADIOLOGY REPORT*  Clinical Data: Central line placement  PORTABLE CHEST - 1 VIEW  Comparison: None.  Findings: Left IJ central line tip terminates over the mid SVC. Lung volumes are low with crowding of the bronchovascular markings. Patchy retrocardiac airspace opacity is noted.  Heart size is normal.  No pleural effusion. No pneumothorax.  IMPRESSION: Appropriately positioned left IJ central line.  Patchy left greater than right airspace opacity, likely atelectasis.  Aspiration or early pneumonia at the left lung base could have a similar appearance.   Original Report Authenticated By: Christiana Pellant, M.D.   Dg Abd Portable 1v  01/28/2013   *RADIOLOGY REPORT*  Clinical Data: Ileus.  Abdominal distention.  PORTABLE ABDOMEN - 1 VIEW  Comparison: 01/25/2013  Findings: Improvement in bowel gas distention since prior study. Mild gaseous distention of transverse colon persists.  NG tube is in the stomach.  No visible free air.  No organomegaly or suspicious calcification.  IMPRESSION: Improving ileus pattern.   Original Report Authenticated By: Charlett Nose, M.D.   Dg Abd Portable 1v  01/25/2013   *RADIOLOGY REPORT*  Clinical Data: Abnormal sponge count during laparotomy.  History of a ruptured uterus.  PORTABLE ABDOMEN - 1 VIEW  Comparison: None.  Findings: An NG tube is in the stomach.  The bowel is dilated and centralized.  No definite radiopaque sponges are identified in the abdomen. Coils are noted in the pelvis.  IMPRESSION: No radiopaque sponges are identified.   Original Report Authenticated By: Rudie Meyer, M.D.    Assessment & Plan:  Discussed details that occurred during her hospitalization in detail, all questions answered.   Patient is very grateful to everyone involved in her care.   She will follow up on 03/12/13 for her six week postpartum appointment, will do pelvic exam at that visit

## 2013-02-11 NOTE — Patient Instructions (Addendum)
Return to clinic for any obstetric concerns or go to [facility] for evaluation  

## 2013-02-12 ENCOUNTER — Encounter (INDEPENDENT_AMBULATORY_CARE_PROVIDER_SITE_OTHER): Payer: Self-pay | Admitting: General Surgery

## 2013-02-12 ENCOUNTER — Ambulatory Visit (INDEPENDENT_AMBULATORY_CARE_PROVIDER_SITE_OTHER): Payer: Medicaid Other | Admitting: General Surgery

## 2013-02-12 VITALS — BP 122/64 | HR 82 | Temp 97.2°F | Resp 16 | Ht 61.0 in | Wt 122.2 lb

## 2013-02-12 DIAGNOSIS — Z09 Encounter for follow-up examination after completed treatment for conditions other than malignant neoplasm: Secondary | ICD-10-CM

## 2013-02-12 MED ORDER — HYDROCODONE-ACETAMINOPHEN 5-325 MG PO TABS: 1.0000 | ORAL_TABLET | ORAL | Status: DC | PRN

## 2013-02-12 NOTE — Progress Notes (Signed)
Subjective:     Patient ID: Mackenzie [name], female   DOB: 10-12-1985, 27 y.o.   MRN: [id number]  HPI 68 yof with complicated hospital course.  I saw in [facility] at [facility] with postoperative bleeding after c/s with ruptured uterus leading to hysterectomy.  There was pelvic bleeding.  I took to or and packed for hemodynamic instability.  Then took to IR for coiling.  Bleeding resolved.  She went back to or twice before abdominal closure could be done. Eventually went home doing well.  Her baby is doing well.  She reports through interpreter today that she is doing well. She still has some abdominal pain but has no fevers, eating well, no n/v, no wound drainage, having bms.  Review of Systems     Objective:   Physical Exam Healed low transverse scar and healed midline incision without infection, staples in place    Assessment:     S/p elap for bleeding, open abdomen s/p closure     Plan:     I removed staples today and applied steristrips.  I explained these will come off over next couple weeks. Discussed restrictions for additional four weeks.  She can see me as needed now. I did explain that she certainly is at risk for hernia lifetime from midline incision.

## 2013-03-12 ENCOUNTER — Ambulatory Visit (INDEPENDENT_AMBULATORY_CARE_PROVIDER_SITE_OTHER): Payer: Medicaid Other | Admitting: Obstetrics & Gynecology

## 2013-03-12 ENCOUNTER — Encounter: Payer: Self-pay | Admitting: Advanced Practice Midwife

## 2013-03-12 VITALS — BP 117/85 | HR 66 | Temp 97.3°F | Ht 62.0 in | Wt 117.5 lb

## 2013-03-12 DIAGNOSIS — Z90710 Acquired absence of both cervix and uterus: Secondary | ICD-10-CM

## 2013-03-12 DIAGNOSIS — N390 Urinary tract infection, site not specified: Secondary | ICD-10-CM

## 2013-03-12 LAB — POCT URINALYSIS DIP (DEVICE)
Bilirubin Urine: NEGATIVE
Glucose, UA: NEGATIVE mg/dL
Ketones, ur: NEGATIVE mg/dL
Specific Gravity, Urine: <=1.005 (ref 1.005–1.030)
pH: 7 (ref 5.0–8.0)

## 2013-03-12 MED ORDER — PHENAZOPYRIDINE HCL 200 MG PO TABS: 200.0000 mg | ORAL_TABLET | Freq: Three times a day (TID) | ORAL | Status: DC | PRN

## 2013-03-12 MED ORDER — CIPROFLOXACIN HCL 500 MG PO TABS: 500.0000 mg | ORAL_TABLET | Freq: Two times a day (BID) | ORAL | Status: DC

## 2013-03-12 NOTE — Patient Instructions (Signed)
Return to clinic for any scheduled appointments or for any gynecologic concerns as needed.   

## 2013-03-12 NOTE — Progress Notes (Signed)
Subjective:     Mackenzie [name] is a 27 y.o. [id number] female who presents for a postpartum visit. Postpartum depression screening: negative. Patient had a complicated postpartum course; see problem list below and previous notes for more details.   Patient Active Problem List    Diagnosis  Date Noted   .  Postoperative respiratory failure  01/24/2013   .  Iliac artery distal branch bleed s/p IR coiling  01/24/2013   .  Retroperitoneal hematoma  01/23/2013   .  Consumptive coagulopathy due to intraoperative and postoperative bleeding  01/23/2013   .  S/P cesarean hysterectomy due to posterior uterine rupture and massive hemorrhage  01/22/2013   .  Hemorrhagic shock due to intraoperative and postoperative bleeding  01/22/2013   .  Language barrier; cultural differences; only speaks Burmese  01/21/2013    Patient was discharged to home on PPD#10. She has been seen by [facility] for followup and vertical incision wound management. She is here with a Burmese interpreter. Denies any vaginal bleeding. She is breastfeeding, infant is doing well. Husband and older son are also doing well.  She reports having burring during urination for the few days and is worried she has a UTI.  Denies fevers, hematuria or other symptoms.  She has not restarted intercourse.  The following portions of the patient's history were reviewed and updated as appropriate: allergies, current medications, past family history, past medical history, past social history, past surgical history and problem list.  Review of Systems Pertinent items are noted in HPI.   Objective:    BP 117/85  Pulse 66  Temp(Src) 97.3 F (36.3 C) (Oral)  Ht 5\' 2"  (1.575 m)  Wt 117 lb 8 oz (53.298 kg)  BMI 21.49 kg/m2  Breastfeeding? Yes  General:  alert and no distress   Breasts:  inspection negative, no nipple discharge or bleeding, no masses or nodularity palpable  Lungs: clear to auscultation bilaterally  Heart:   regular rate and rhythm  Abdomen: soft, non-tender; bowel sounds normal; no masses,  no organomegaly  Incisions: well-healing low transverse and vertical incisions, no erythema, induration or drainage. In the mid portion of the vertical incision, a suture was noted to be sticking out and this was excised.  Steristrips were placed on a few areas of superficial separation, she was told these will fall off on their own.   Vulva:  normal  Vagina: normal vagina, no discharge, exudate, lesion, or erythema  Cervix:  difficult to visualize cervix s/p cesarean hysterectomy, it is noted to be flush with vaginal wall.  Dimple was seen and confirmed to be cervical os after being probed with an endocervical brush.  Patient still needs pap smear screenings.  Corpus: uterus absent  Adnexa:  negative for enlargement, mass and tenderness  Rectal Exam: Not performed.     Results for orders placed in visit on 03/12/13 (from the past 24 hour(s))  POCT URINALYSIS DIP (DEVICE)     Status: Abnormal   Collection Time    03/12/13  9:40 AM      Result Value Range   Glucose, UA NEGATIVE  NEGATIVE mg/dL   Bilirubin Urine NEGATIVE  NEGATIVE   Ketones, ur NEGATIVE  NEGATIVE mg/dL   Specific Gravity, Urine <=1.005  1.005 - 1.030   Hgb urine dipstick SMALL (*) NEGATIVE   pH 7.0  5.0 - 8.0   Protein, ur NEGATIVE  NEGATIVE mg/dL   Urobilinogen, UA 0.2  0.0 - 1.0 mg/dL  Nitrite NEGATIVE  NEGATIVE   Leukocytes, UA LARGE (*) NEGATIVE   Assessment:   Patient is here for six week postpartum exam after complicated intrapartum and postpartum course. She is healing well.  Pap smear not done at today's visit, due in one year.  Patient has symptoms concerning for UTI.  UA is equivocal.  Plan:   Ciprofloxacin and Pyridium prescribed for UTI symptoms, she was told to call if symptoms do not resolve. Patient informed she still needs pap smear; she will return here or to the [facility].  Pelvic rest restrictions lifted. Routine  preventative health maintenance measures emphasized.

## 2013-07-16 ENCOUNTER — Encounter: Payer: Self-pay | Admitting: *Deleted

## 2014-02-01 ENCOUNTER — Encounter: Payer: Self-pay | Admitting: *Deleted

## 2014-06-02 ENCOUNTER — Encounter: Payer: Self-pay | Admitting: Obstetrics & Gynecology

## 2014-06-02 ENCOUNTER — Ambulatory Visit (INDEPENDENT_AMBULATORY_CARE_PROVIDER_SITE_OTHER): Payer: Self-pay | Admitting: Obstetrics & Gynecology

## 2014-06-02 VITALS — BP 110/82 | HR 58 | Temp 98.4°F | Ht 60.25 in | Wt 97.3 lb

## 2014-06-02 DIAGNOSIS — L910 Hypertrophic scar: Secondary | ICD-10-CM

## 2014-06-02 NOTE — Progress Notes (Signed)
States having abdominal pain ever since she had surgery after had baby. Also having trouble with  urination.

## 2014-06-02 NOTE — Progress Notes (Signed)
Subjective:     Patient ID: Mackenzie [name], female   DOB: 24-Oct-1985, 28 y.o.   MRN: [id number]  HPI Pt c/o pain of keloid that she has noted since surgery that is getting worse.  She reports that the pain is not UNDER the Keloid but within the scar.     Review of Systems     Objective:   Physical Exam BP 110/82  Pulse 58  Temp(Src) 98.4 F (36.9 C)  Ht 5' 0.25" (1.53 m)  Wt 97 lb 4.8 oz (44.135 kg)  BMI 18.85 kg/m2  LMP 03/22/2012 Pt in NAD Abd: large vertical incision with keloid.   The skin at the  Apex of the incision is pulling and this is where the pt c/o pain.  Incision injected with 5cc of Kenalog 40 mixed with 2cc of 2% lidocaine.  It was very difficult to inject into the scar due to the keloid being very hard          Assessment:     Keloid of surgical incision.  S/p injection with kenalog into scar      Plan:     rec f/u weekly for injection with Kenalog until the keloid is flat

## 2014-06-10 ENCOUNTER — Ambulatory Visit (INDEPENDENT_AMBULATORY_CARE_PROVIDER_SITE_OTHER): Payer: Medicaid Other | Admitting: Family Medicine

## 2014-06-10 ENCOUNTER — Encounter: Payer: Self-pay | Admitting: Family Medicine

## 2014-06-10 VITALS — BP 121/84 | HR 67 | Temp 97.8°F | Wt 97.9 lb

## 2014-06-10 DIAGNOSIS — L910 Hypertrophic scar: Secondary | ICD-10-CM

## 2014-06-10 MED ORDER — SENNOSIDES-DOCUSATE SODIUM 8.6-50 MG PO TABS: 1.0000 | ORAL_TABLET | Freq: Two times a day (BID) | ORAL | Status: AC

## 2014-06-10 NOTE — Progress Notes (Signed)
Keloid improving.  On exam: large vertical incision with keloid. Incision injected with 5cc of Kenalog 40 mixed with 2cc of 2% lidocaine. Difficult to inject in some areas due to the keloid being very hard.  Pt tolerated procedure well.  F/u next week for repeat injection.

## 2014-06-15 ENCOUNTER — Encounter: Payer: Self-pay | Admitting: *Deleted

## 2014-06-16 ENCOUNTER — Ambulatory Visit (INDEPENDENT_AMBULATORY_CARE_PROVIDER_SITE_OTHER): Payer: Medicaid Other | Admitting: Family Medicine

## 2014-06-16 VITALS — BP 123/89 | HR 65 | Temp 98.1°F | Wt 96.3 lb

## 2014-06-16 DIAGNOSIS — L910 Hypertrophic scar: Secondary | ICD-10-CM

## 2014-06-16 NOTE — Progress Notes (Signed)
Keloid improving - reports no pain. On exam: large vertical incision with keloid. Incision injected with 5cc of Kenalog 40 mixed with 2cc of 2% lidocaine. Difficult to inject in some areas due to the keloid being very hard. Pt tolerated procedure well.  Patient would like to stop injections as pain has resolved.  F/u prn

## 2014-06-17 ENCOUNTER — Ambulatory Visit: Payer: Medicaid Other | Admitting: Obstetrics & Gynecology

## 2014-07-04 ENCOUNTER — Encounter: Payer: Self-pay | Admitting: Family Medicine

## 2014-09-15 ENCOUNTER — Encounter (HOSPITAL_COMMUNITY): Payer: Self-pay | Admitting: Obstetrics and Gynecology

## 2014-11-11 IMAGING — CR DG CHEST 1V PORT
1 series · 1 of 1 positions shown · non-contrast
Comparison: None.

CLINICAL DATA: Central line placement

PORTABLE CHEST - 1 VIEW

[view not recorded]
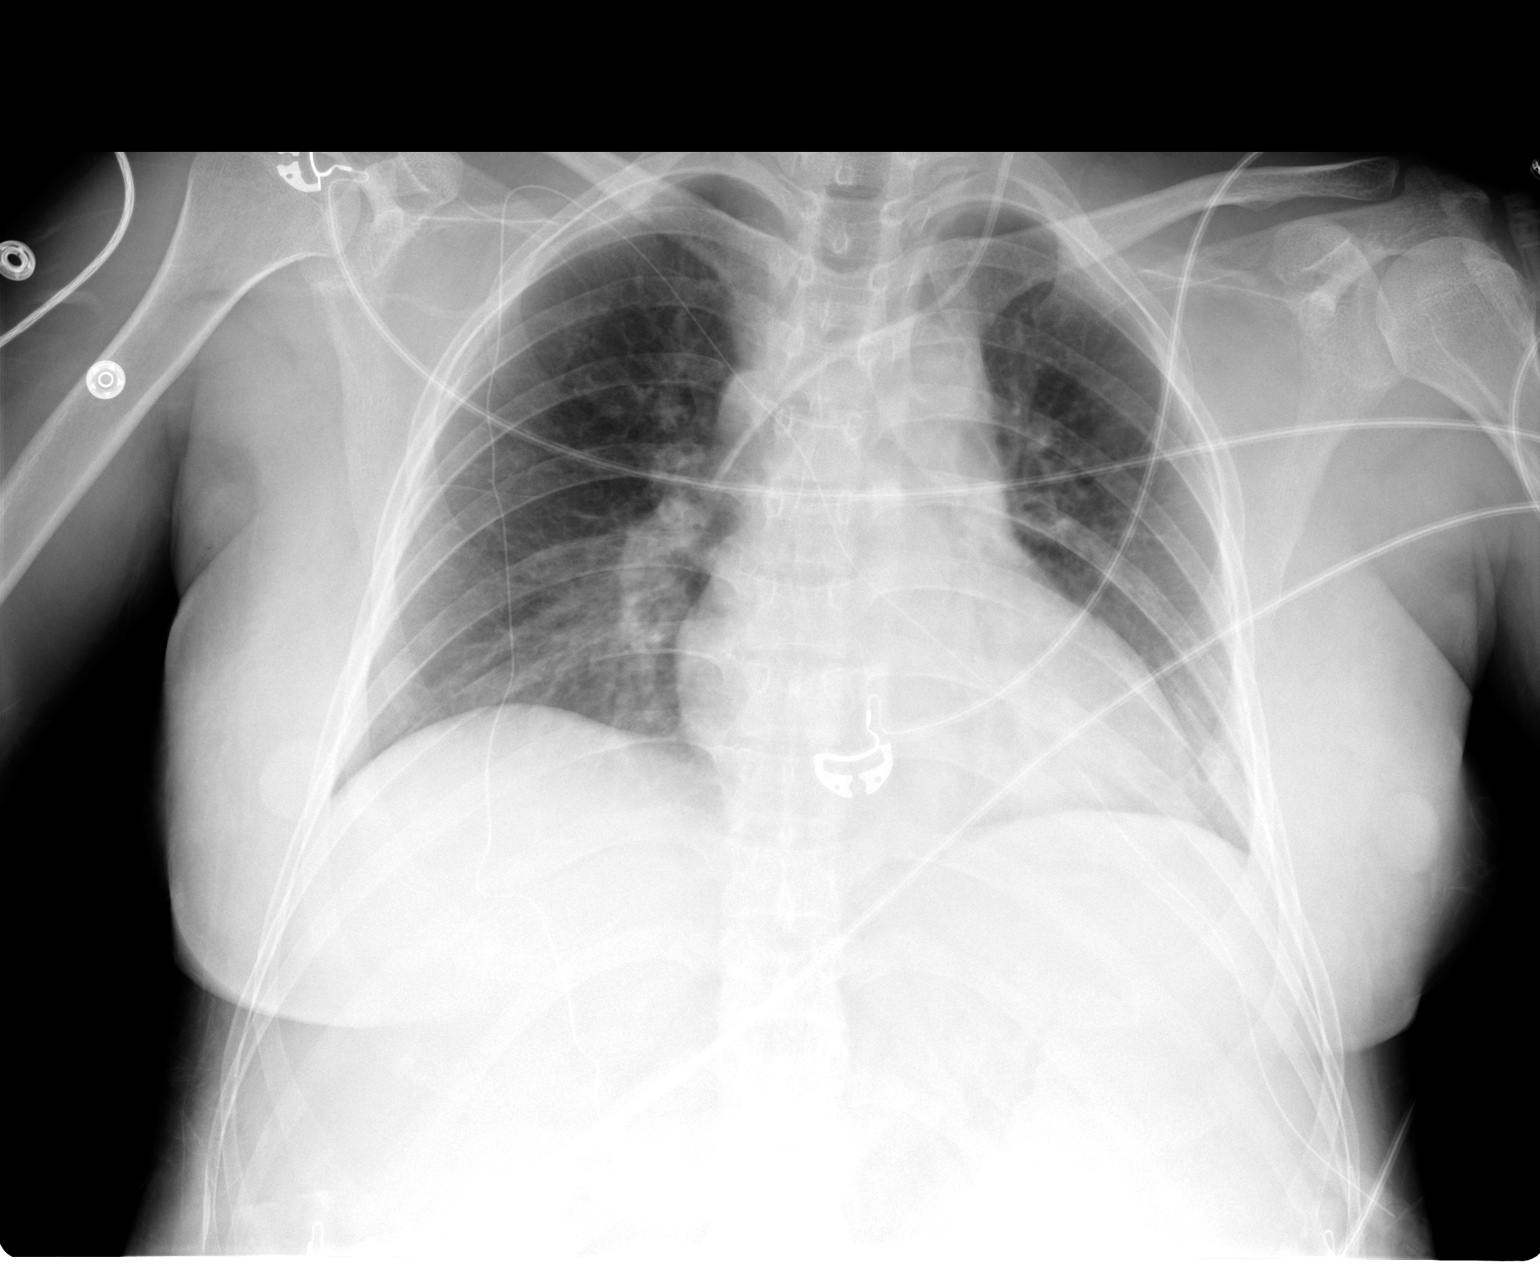

[1 of 1 positions shown; findings below may reference images not displayed]

FINDINGS: Left IJ central line tip terminates over the mid SVC.
Lung volumes are low with crowding of the bronchovascular markings.
Patchy retrocardiac airspace opacity is noted.  Heart size is
normal.  No pleural effusion. No pneumothorax.
IMPRESSION: Appropriately positioned left IJ central line.

Patchy left greater than right airspace opacity, likely
atelectasis.  Aspiration or early pneumonia at the left lung base
could have a similar appearance.

## 2014-11-13 IMAGING — CR DG CHEST 1V PORT
1 series · 1 of 1 positions shown · non-contrast
Comparison: Portable exam 7220 hours compared to 01/23/2013

CLINICAL DATA: Follow up pulmonary infiltrates

PORTABLE CHEST - 1 VIEW

[AP]
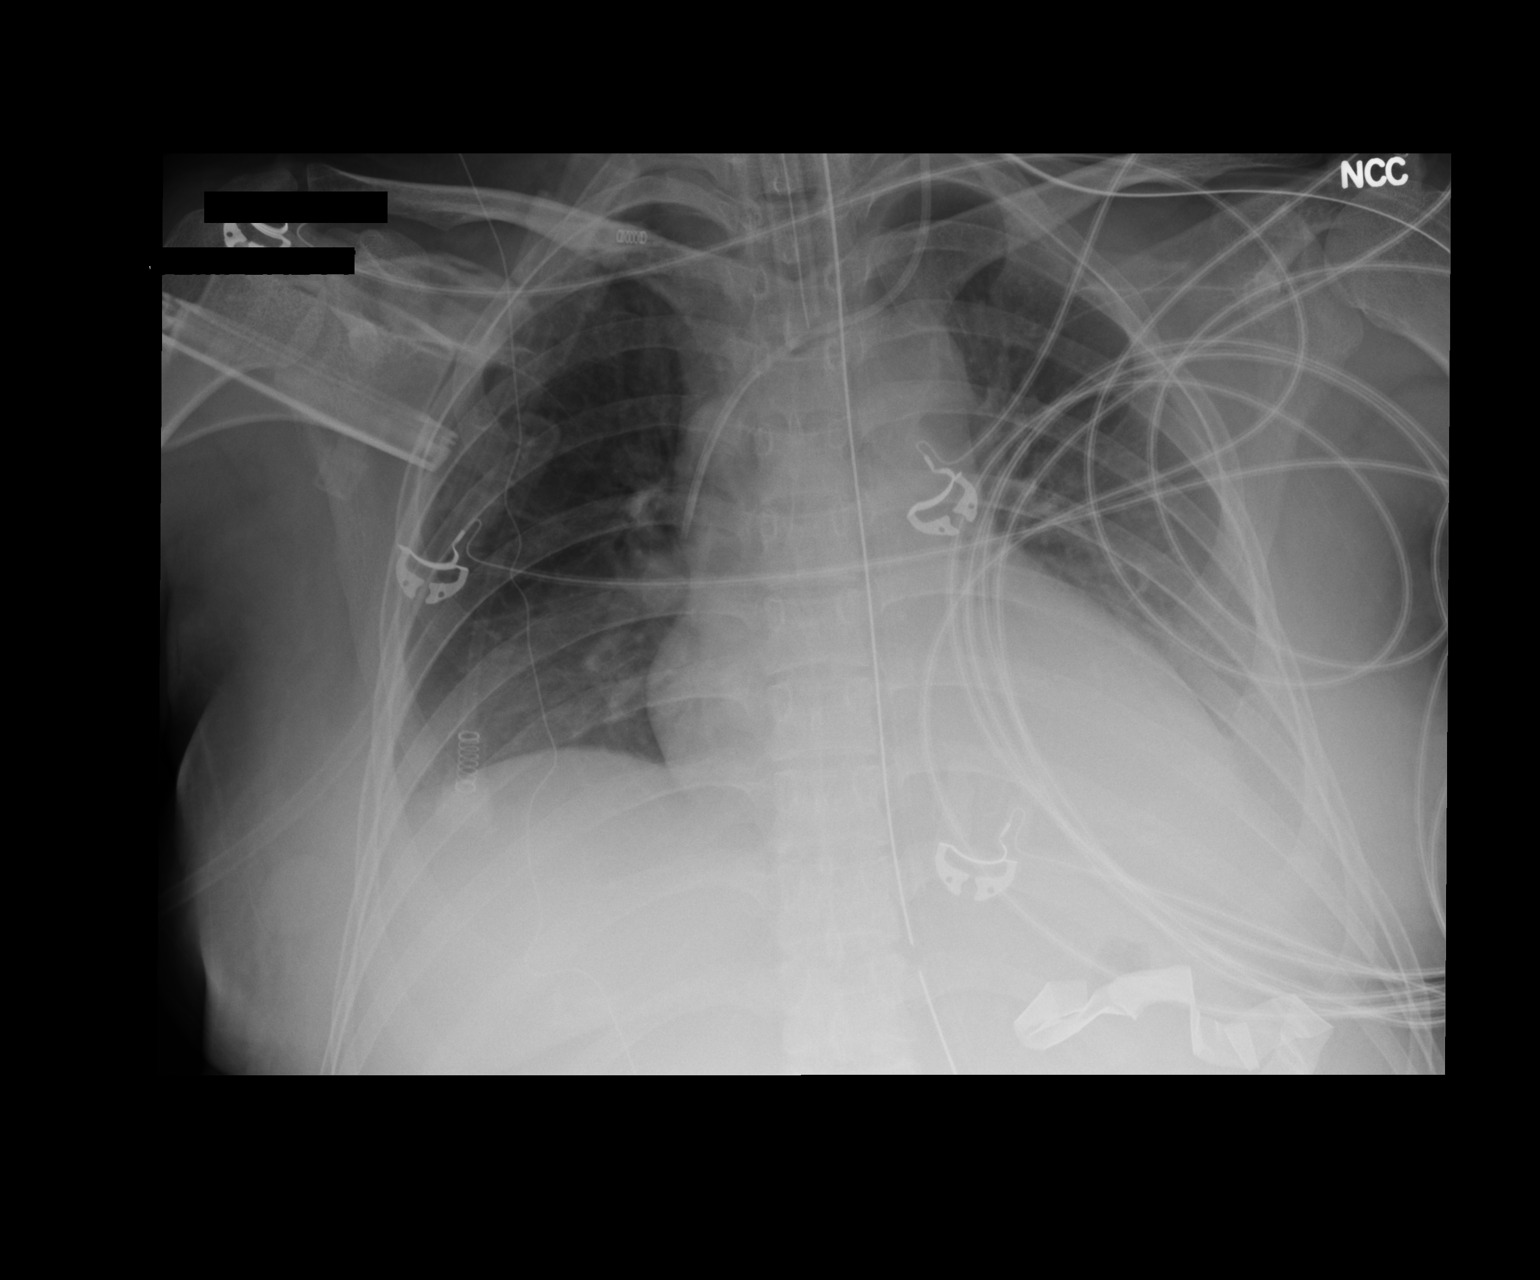

[1 of 1 positions shown; findings below may reference images not displayed]

FINDINGS: Tip of endotracheal tube 2.9 cm above carina.
Nasogastric tube ascends into stomach.
Left jugular central venous catheter tip projects over SVC.
Numerous cardiac monitoring leads project over chest.
Enlargement of cardiac silhouette.
Mediastinal contours and pulmonary vascularity normal for
technique.
Left lower lobe atelectasis versus consolidation persists.
Minimal atelectasis right base.
Upper lungs clear.
Cannot exclude small left pleural effusion.
No pneumothorax.
IMPRESSION: Atelectasis at right base with persistent atelectasis versus
consolidation in left lower lobe.

## 2014-11-14 IMAGING — CR DG CHEST 1V PORT
1 series · 1 of 1 positions shown · non-contrast
Comparison: 01/24/2013.

CLINICAL DATA: Endotracheal tube position.

PORTABLE CHEST - 1 VIEW

[AP]
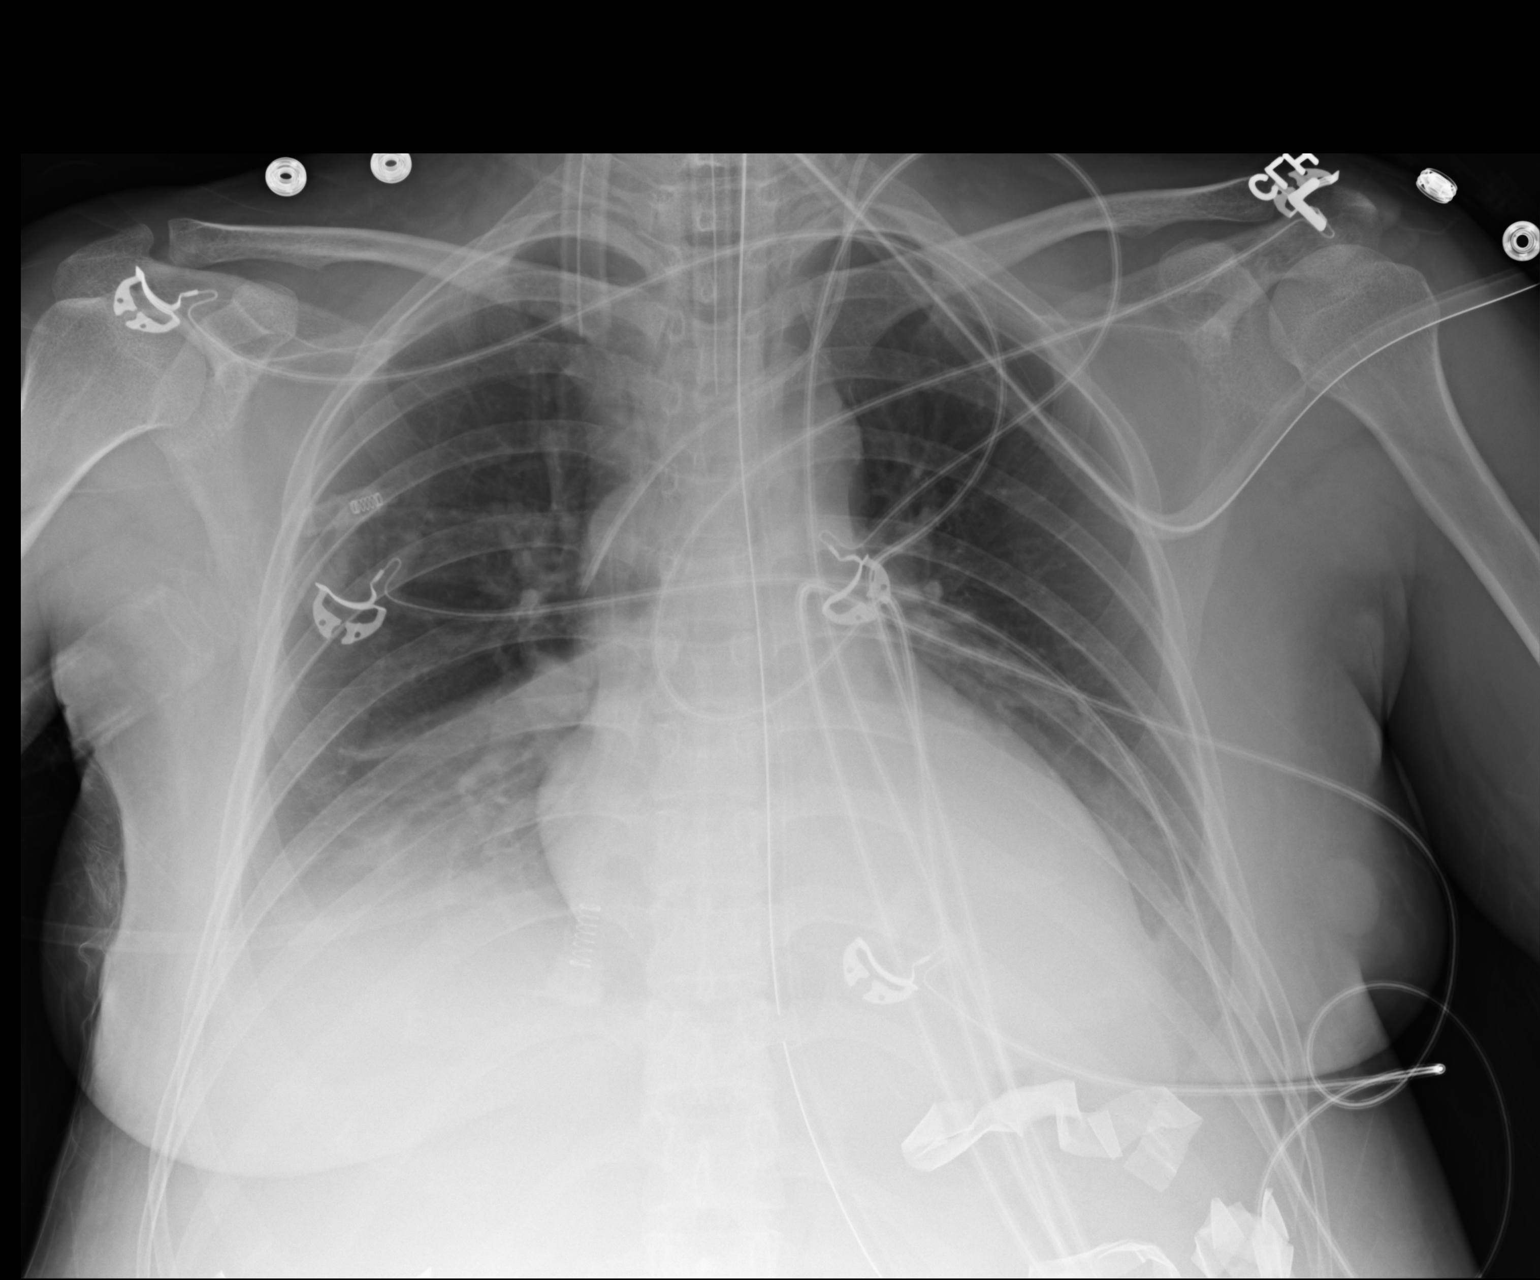

[1 of 1 positions shown; findings below may reference images not displayed]

FINDINGS: The endotracheal tube is in good position, unchanged.
The left IJ catheter and NG tubes are stable.  The heart is mildly
enlarged but unchanged.  Persistent bibasilar atelectasis or
infiltrates and small effusions.
IMPRESSION: 1.  Stable support apparatus.
2.  Persistent effusions and bibasilar atelectasis or infiltrates.

## 2015-02-09 ENCOUNTER — Encounter (HOSPITAL_COMMUNITY): Payer: Self-pay | Admitting: Obstetrics and Gynecology

## 2015-05-17 ENCOUNTER — Inpatient Hospital Stay (HOSPITAL_COMMUNITY)
Admission: AD | Admit: 2015-05-17 | Discharge: 2015-05-17 | Disposition: A | Discharge status: Home / Self Care | Payer: Self-pay | Source: Ambulatory Visit | Attending: Obstetrics & Gynecology | Admitting: Obstetrics & Gynecology

## 2015-05-17 ENCOUNTER — Encounter (HOSPITAL_COMMUNITY): Payer: Self-pay | Admitting: *Deleted

## 2015-05-17 DIAGNOSIS — K429 Umbilical hernia without obstruction or gangrene: Secondary | ICD-10-CM | POA: Insufficient documentation

## 2015-05-17 NOTE — MAU Provider Note (Signed)
History     CSN: [id number]  Arrival date and time: 05/17/15 1806   First Provider Initiated Contact with Patient 05/17/15 1831      Chief Complaint  Patient presents with  . Abdominal Pain   HPI Mackenzie [name] 29 y.o. [id number] nonpregnant female presents for a knot in her stomach.  It comes and goes.  It first started a week or two ago.  There is no pain.  There is no nausea or vomiting.  Also denies dysuria, vaginal bleeding, vaginal discharge.   OB History    Gravida Para Term Preterm AB TAB SAB Ectopic Multiple Living   Past Medical History  Diagnosis Date  . Varicose veins   . Hx of macrosomia in infant in prior pregnancy, currently pregnant     Past Surgical History  Procedure Laterality Date  . Cesarean section    . Cesarean section N/A 01/22/2013    Procedure: CESAREAN SECTION;  Surgeon: Tereso Newcomer, MD;  Location: [facility];  Service: Obstetrics;  Laterality: N/A;  . Abdominal hysterectomy N/A 01/22/2013    Procedure: HYSTERECTOMY ABDOMINAL;  Surgeon: Tereso Newcomer, MD;  Location: [facility];  Service: Obstetrics;  Laterality: N/A;  . Cystoscopy N/A 01/22/2013    Procedure: CYSTOSCOPY;  Surgeon: Tereso Newcomer, MD;  Location: [facility];  Service: Obstetrics;  Laterality: N/A;  . Salpingoophorectomy Right 01/22/2013    Procedure: SALPINGO OOPHORECTOMY;  Surgeon: Tereso Newcomer, MD;  Location: [facility];  Service: Obstetrics;  Laterality: Right;  . Laparotomy N/A 01/23/2013    Procedure: EXPLORATORY LAPAROTOMY/left fimbriectomy/evacuation of hemoperitoneum;  Surgeon: Tereso Newcomer, MD;  Location: [facility];  Service: Gynecology;  Laterality: N/A;  . Laparotomy N/A 01/23/2013    Procedure: EXPLORATORY LAPAROTOMY;  Surgeon: Emelia Loron, MD;  Location: [facility] OR;  Service: General;  Laterality: N/A;  . Laparotomy N/A 01/25/2013    Procedure: EXPLORATORY LAPAROTOMY;  Surgeon: Emelia Loron, MD;  Location: [facility] OR;  Service: General;  Laterality: N/A;  .  Vacuum assisted closure change N/A 01/25/2013    Procedure: ABDOMINAL VACUUM ASSISTED CLOSURE CHANGE;  Surgeon: Emelia Loron, MD;  Location: [facility] OR;  Service: General;  Laterality: N/A;  . Vacuum assisted closure change N/A 01/27/2013    Procedure: Abdominal Washout and Abdominal Closure with Removal Wound Vac;  Surgeon: Axel Filler, MD;  Location: [facility] OR;  Service: General;  Laterality: N/A;    Family History  Problem Relation Age of Onset  . Alcohol abuse Neg Hx   . Arthritis Neg Hx   . Asthma Neg Hx   . Birth defects Neg Hx   . Cancer Neg Hx   . COPD Neg Hx   . Depression Neg Hx   . Diabetes Neg Hx   . Drug abuse Neg Hx   . Early death Neg Hx   . Hearing loss Neg Hx   . Heart disease Neg Hx   . Kidney disease Neg Hx   . Hypertension Neg Hx   . Hyperlipidemia Neg Hx   . Learning disabilities Neg Hx   . Mental illness Neg Hx   . Mental retardation Neg Hx   . Miscarriages / Stillbirths Neg Hx   . Stroke Neg Hx   . Vision loss Neg Hx     Social History  Substance Use Topics  . Smoking status: Never Smoker   . Smokeless tobacco: Never Used  . Alcohol  Use: No    Allergies: No Known Allergies  Prescriptions prior to admission  Medication Sig Dispense Refill Last Dose  . senna-docusate (SENOKOT-S) 8.6-50 MG per tablet Take 1 tablet by mouth 2 (two) times daily. 10 tablet 0 Not Taking    ROS Pertinent ROS in HPI.  All other systems are negative.   Physical Exam   Blood pressure 114/73, pulse 66, temperature 98.2 F (36.8 C), resp. rate 16, last menstrual period 03/22/2012, currently breastfeeding.  Physical Exam  Constitutional: She is oriented to person, place, and time. She appears well-developed and well-nourished. No distress.  HENT:  Head: Normocephalic and atraumatic.  Eyes: EOM are normal.  Neck: Normal range of motion.  Cardiovascular: Normal rate.   Respiratory: No respiratory distress.  GI: Soft. There is no tenderness.  1x1x2 inch mass observed  midline along vertical incision.  It is initially firm.  With light pressure, it is able to be reduced and no further firmness noted.   Pt in supine position and asked to raise her neck.  Hernia does not recur.  After sitting up several times, it does recur and pt is able to self-reduce.  Neurological: She is alert and oriented to person, place, and time.  Skin: Skin is warm and dry.  Psychiatric: She has a normal mood and affect.    MAU Course  Procedures  MDM No GYN problem  Assessment and Plan   1. Umbilical hernia without obstruction and without gangrene    P: Discharge to home Precautions emphasized: If bulge becomes painful, hard, unable to be reduced - pt to go to [facility] immediately as this is a surgical emergency.   This information was repeated multiple times with the interpreter.  Pt and husband indicate understanding and husband repeats back in [place].  Pt also given info for [facility] to have appt for non-emergency repair.   Complications - go to [facility].    Bertram Denver 05/17/2015, 6:42 PM

## 2015-05-17 NOTE — Discharge Instructions (Signed)
Hernia °A hernia happens when an organ inside your body pushes out through a weak spot in your belly (abdominal) wall. Most hernias get worse over time. They can often be pushed back into place (reduced). Surgery may be needed to repair hernias that cannot be pushed into place. °HOME CARE °· Keep doing normal activities. °· Avoid lifting more than 10 pounds (4.5 kilograms). °· Cough gently and avoid straining. Over time, these things will: °¨ Increase your hernia size. °¨ Irritate your hernia. °¨ Break down hernia repairs. °· Stop smoking. °· Do not wear anything tight over your hernia. Do not keep the hernia in with an outside bandage. °· Eat food that is high in fiber (fruit, vegetables, whole grains). °· Drink enough fluids to keep your pee (urine) clear or pale yellow. °· Take medicines to make your poop soft (stool softeners) if you cannot poop (constipated). °GET HELP RIGHT AWAY IF:  °· You have a fever. °· You have belly pain that gets worse. °· You feel sick to your stomach (nauseous) and throw up (vomit). °· Your skin starts to bulge out. °· Your hernia turns a different color, feels hard, or is tender. °· You have increased pain or puffiness (swelling) around the hernia. °· You poop more or less often. °· Your poop does not look the way normally does. °· You have watery poop (diarrhea). °· You cannot push the hernia back in place by applying gentle pressure while lying down. °MAKE SURE YOU:  °· Understand these instructions. °· Will watch your condition. °· Will get help right away if you are not doing well or get worse. °Document Released: 02/06/2010 Document Revised: 11/11/2011 Document Reviewed: 02/06/2010 °[employer]® Patient Information ©2015 [employer]. This information is not intended to replace advice given to you by your health care provider. Make sure you discuss any questions you have with your health care provider. ° °

## 2015-05-17 NOTE — MAU Note (Signed)
Pt presents to MAU with complaints of abdominal pain at her incision site from surgery two years ago.

## 2015-05-31 ENCOUNTER — Telehealth: Payer: Self-pay | Admitting: *Deleted

## 2015-05-31 NOTE — Telephone Encounter (Signed)
A female identifying himself as Gala Romney states he is calling for [place] as she is not [place] speaking. States Dr. Erin Fulling was supposed to be sending a referral to [facility]. States he called 2 weeks ago and hasn't heard anything. States patient and husband speak [place]- can call husband [phone]

## 2015-06-05 NOTE — Telephone Encounter (Signed)
[facility]. Pt call their clinic and set up appointment for 06/07/15 @ 9:20.

## 2015-06-12 ENCOUNTER — Encounter: Payer: Self-pay | Admitting: *Deleted
